# Patient Record
Sex: Female | Born: 1950 | Race: White | Hispanic: No | Marital: Married | Smoking: Never smoker
Health system: Southern US, Community
[De-identification: ages and names within clinical notes are randomized; demographics above are authoritative.]

## PROBLEM LIST (undated history)

## (undated) DIAGNOSIS — R519 Headache, unspecified: Secondary | ICD-10-CM

## (undated) DIAGNOSIS — K219 Gastro-esophageal reflux disease without esophagitis: Secondary | ICD-10-CM

## (undated) DIAGNOSIS — M654 Radial styloid tenosynovitis [de Quervain]: Secondary | ICD-10-CM

## (undated) DIAGNOSIS — D649 Anemia, unspecified: Secondary | ICD-10-CM

## (undated) DIAGNOSIS — I10 Essential (primary) hypertension: Secondary | ICD-10-CM

## (undated) DIAGNOSIS — L570 Actinic keratosis: Secondary | ICD-10-CM

## (undated) DIAGNOSIS — D126 Benign neoplasm of colon, unspecified: Secondary | ICD-10-CM

## (undated) DIAGNOSIS — E785 Hyperlipidemia, unspecified: Secondary | ICD-10-CM

## (undated) DIAGNOSIS — M069 Rheumatoid arthritis, unspecified: Secondary | ICD-10-CM

## (undated) HISTORY — PX: COLONOSCOPY: SHX174

## (undated) HISTORY — PX: APPENDECTOMY: SHX54

## (undated) HISTORY — PX: FLEXIBLE SIGMOIDOSCOPY: SHX5431

## (undated) HISTORY — DX: Actinic keratosis: L57.0

---

## 2006-11-26 ENCOUNTER — Ambulatory Visit: Payer: Self-pay | Admitting: Family Medicine

## 2007-03-01 ENCOUNTER — Ambulatory Visit: Payer: Self-pay | Admitting: Unknown Physician Specialty

## 2008-02-29 ENCOUNTER — Ambulatory Visit: Payer: Self-pay | Admitting: Family Medicine

## 2009-03-07 ENCOUNTER — Ambulatory Visit: Payer: Self-pay | Admitting: Family Medicine

## 2010-04-25 ENCOUNTER — Ambulatory Visit: Payer: Self-pay | Admitting: Family Medicine

## 2011-06-10 ENCOUNTER — Ambulatory Visit: Payer: Self-pay | Admitting: Family Medicine

## 2012-03-02 ENCOUNTER — Ambulatory Visit: Payer: Self-pay | Admitting: Family Medicine

## 2012-04-05 ENCOUNTER — Ambulatory Visit: Payer: Self-pay | Admitting: Unknown Physician Specialty

## 2012-04-05 LAB — BASIC METABOLIC PANEL
Anion Gap: 7 (ref 7–16)
BUN: 5 mg/dL — ABNORMAL LOW (ref 7–18)
Chloride: 103 mmol/L (ref 98–107)
Co2: 27 mmol/L (ref 21–32)
Creatinine: 0.78 mg/dL (ref 0.60–1.30)
EGFR (African American): >60
Glucose: 87 mg/dL (ref 65–99)
Potassium: 4.2 mmol/L (ref 3.5–5.1)
Sodium: 137 mmol/L (ref 136–145)

## 2012-04-06 ENCOUNTER — Ambulatory Visit: Payer: Self-pay | Admitting: Surgery

## 2012-07-20 ENCOUNTER — Ambulatory Visit: Payer: Self-pay | Admitting: Family Medicine

## 2013-07-21 ENCOUNTER — Ambulatory Visit: Payer: Self-pay | Admitting: Family Medicine

## 2013-10-06 ENCOUNTER — Ambulatory Visit: Payer: Self-pay | Admitting: Podiatry

## 2014-03-31 DIAGNOSIS — M059 Rheumatoid arthritis with rheumatoid factor, unspecified: Secondary | ICD-10-CM | POA: Insufficient documentation

## 2014-08-23 ENCOUNTER — Ambulatory Visit: Payer: Self-pay | Admitting: Family Medicine

## 2014-10-30 ENCOUNTER — Ambulatory Visit: Payer: Self-pay | Admitting: Family Medicine

## 2014-11-01 ENCOUNTER — Ambulatory Visit: Payer: Self-pay | Admitting: Family Medicine

## 2014-11-15 ENCOUNTER — Ambulatory Visit: Payer: Self-pay | Admitting: Family Medicine

## 2014-12-19 NOTE — H&P (Signed)
PATIENT NAME:  Deborah Singh, Deborah Singh MR#:  [id number] DATE OF BIRTH:  [birth year]  DATE OF ADMISSION:  04/05/2012  HISTORY OF PRESENT ILLNESS: This 64 year old female was referred by Dr. Vira Agar with chief complaint of abnormal colonoscopy and CT findings indicative of appendicitis.   She just had her screening colonoscopy today. There was some evidence of hemorrhoids and also Dr. Vira Agar found evidence of edema in the cecum with a small amount of exudate. Biopsies were taken. Postoperatively she did have a CT scan of the abdomen. She did leave the CT scanner and went out to eat and had regular diet for lunch. Later Dr. Vira Agar received a call from the radiologist indicating findings of appendicitis, and the patient was referred here. I reviewed the CT findings with report of a dilated appendix which was 1.6 cm in diameter with mild inflammatory changes in the periappendiceal fat, a trace amount of fluid, and these findings were reported as concerning for appendicitis.   The patient reports no abdominal pain. She says she has been eating well, having no nausea or vomiting. She has been moving her bowels satisfactorily, tolerated her colon prep very well, and tolerated her lunch after the procedure. She reports no recent chills or fever.  PAST MEDICAL HISTORY:  1. Past medical history was reviewed and does include rheumatoid arthritis. She has been under the care of Dr. Precious Reel and on treatment. She does have some minor discomfort in her fingers.  2. History of hypertension. 3. Hyperlipidemia. 4. Gastroesophageal reflux disease.   She reports no history of heart disease, lung disease, or hepatitis, and no history of diabetes.   MEDICATIONS:  1. Aspirin 81 mg daily and has been taking her aspirin every day.  2. Calcium tablet twice a day. 3. Folic acid 1 mg daily. 4. Lisinopril/hydrochlorothiazide 10/12.5 mg daily.  5. Multiple vitamin daily. 6. Oxaprozin 600 mg daily, but has discontinued that  just recently prior to colonoscopy.  7. Prilosec 20 mg daily.  8. Simvastatin 20 mg daily. 9. Methotrexate 2.5 mg each Wednesday, so it has been five days since the last dose.   ALLERGIES: Penicillin - which causes some shortness of breath.   SOCIAL HISTORY: She works at [employer]. She does not smoke, occasionally drinks some alcohol.   FAMILY HISTORY: Positive for diabetes, heart disease, hypertension, and kidney disease. Family history is negative for colon cancer and negative for rectal cancer.   REVIEW OF SYSTEMS: No recent acute illness such as cough, cold, or sore throat. No nausea, vomiting, constipation, or diarrhea. No abdominal pain. She reports no difficulties breathing. Review of systems is otherwise negative.   PHYSICAL EXAMINATION:   GENERAL: She is awake, alert, and oriented, ambulatory, in no acute distress.   VITAL SIGNS: Height is 5 feet 4 inches, weight 130, blood pressure 121/81, and pulse 76.   SKIN: Warm and dry without rash.   HEENT: Pupils are equal and reactive to light. Extraocular movements are intact. Sclerae clear. Pharynx clear.   NECK: No palpable mass.   LUNGS: Lungs sounds were clear.   HEART: Regular rhythm, S1 and S2.   ABDOMEN: Soft, flat, and nontender.   EXTREMITIES: No dependent edema.   NEUROLOGIC: Awake, alert, and oriented, moving all extremities.   RESULTS: CT images were reviewed and colonoscopy report reviewed.   IMPRESSION: Chronic appendicitis.   RECOMMENDATIONS: I recommended laparoscopic appendectomy. I have discussed the operation, care, risks, and benefits with her in detail. We are going to keep her n.p.o. past  midnight, have her take her omeprazole and lisinopril with hydrochlorothiazide in the morning with a sip of water, and we are going to have her do a preop, and get CBC and metabolic panel B. We will get her surgery scheduled for tomorrow and give a preoperative dose of Invanz. I discussed the operation, care, risks, and  benefits and the small possibility of having to open with her. ____________________________ J. Rochel Brome, MD jws:slb D: 04/05/2012 17:28:52 ET T: 04/05/2012 17:45:20 ET JOB#: [id number]  cc: Loreli Dollar, MD, <Dictator> Irven Easterly. Kary Kos, MD Manya Silvas, MD

## 2014-12-19 NOTE — H&P (Signed)
PATIENT NAME:  Deborah Singh, Deborah Singh MR#:  [id number] DATE OF BIRTH:  06-01-1951  DATE OF ADMISSION:  04/06/2012  HISTORY OF PRESENT ILLNESS: This 64 year old female was referred by Dr. Vira Agar with chief complaint of abnormal colonoscopy and CT findings indicative of appendicitis.   She just had her screening colonoscopy today. There was some evidence of hemorrhoids and also Dr. Vira Agar found evidence of edema in the cecum with a small amount of exudate. Biopsies were taken. Postoperatively she did have a CT scan of the abdomen. She did leave the CT scanner and went out to eat and had regular diet for lunch. Later Dr. Vira Agar received a call from the radiologist indicating findings of appendicitis, and the patient was referred here. I reviewed the CT findings with report of a dilated appendix which was 1.6 cm in diameter with mild inflammatory changes in the periappendiceal fat, a trace amount of fluid, and these findings were reported as concerning for appendicitis.   The patient reports no abdominal pain. She says she has been eating well, having no nausea or vomiting. She has been moving her bowels satisfactorily, tolerated her colon prep very well, and tolerated her lunch after the procedure. She reports no recent chills or fever.  PAST MEDICAL HISTORY:  1. Past medical history was reviewed and does include rheumatoid arthritis. She has been under the care of Dr. Precious Reel and on treatment. She does have some minor discomfort in her fingers.  2. History of hypertension. 3. Hyperlipidemia. 4. Gastroesophageal reflux disease.   She reports no history of heart disease, lung disease, or hepatitis, and no history of diabetes.   MEDICATIONS:  1. Aspirin 81 mg daily and has been taking her aspirin every day.  2. Calcium tablet twice a day. 3. Folic acid 1 mg daily. 4. Lisinopril/hydrochlorothiazide 10/12.5 mg daily.  5. Multiple vitamin daily. 6. Oxaprozin 600 mg daily, but has discontinued that  just recently prior to colonoscopy.  7. Prilosec 20 mg daily.  8. Simvastatin 20 mg daily. 9. Methotrexate 2.5 mg each Wednesday, so it has been five days since the last dose.   ALLERGIES: Penicillin - which causes some shortness of breath.   SOCIAL HISTORY: She works at [employer]. She does not smoke, occasionally drinks some alcohol.   FAMILY HISTORY: Positive for diabetes, heart disease, hypertension, and kidney disease. Family history is negative for colon cancer and negative for rectal cancer.   REVIEW OF SYSTEMS: No recent acute illness such as cough, cold, or sore throat. No nausea, vomiting, constipation, or diarrhea. No abdominal pain. She reports no difficulties breathing. Review of systems is otherwise negative.   PHYSICAL EXAMINATION:   GENERAL: She is awake, alert, and oriented, ambulatory, in no acute distress.   VITAL SIGNS: Height is 5 feet 4 inches, weight 130, blood pressure 121/81, and pulse 76.   SKIN: Warm and dry without rash.   HEENT: Pupils are equal and reactive to light. Extraocular movements are intact. Sclerae clear. Pharynx clear.   NECK: No palpable mass.   LUNGS: Lungs sounds were clear.   HEART: Regular rhythm, S1 and S2.   ABDOMEN: Soft, flat, and nontender.   EXTREMITIES: No dependent edema.   NEUROLOGIC: Awake, alert, and oriented, moving all extremities.   RESULTS: CT images were reviewed and colonoscopy report reviewed.   IMPRESSION: Chronic appendicitis.   RECOMMENDATIONS: I recommended laparoscopic appendectomy. I have discussed the operation, care, risks, and benefits with her in detail. We are going to keep her n.p.o. past  midnight, have her take her omeprazole and lisinopril with hydrochlorothiazide in the morning with a sip of water, and we are going to have her do a preop, and get CBC and metabolic panel B. We will get her surgery scheduled for tomorrow and give a preoperative dose of Invanz. I discussed the operation, care, risks, and  benefits and the small possibility of having to open with her. ____________________________ J. Rochel Brome, MD jws:slb D: 04/05/2012 17:28:00 ET T: 04/05/2012 17:45:20 ET JOB#: [id number]  cc: Irven Easterly. Kary Kos, MD Manya Silvas, MD J. Rochel Brome, MD, <Dictator>  Loreli Dollar MD ELECTRONICALLY SIGNED 04/07/2012 8:57

## 2014-12-19 NOTE — Op Note (Signed)
PATIENT NAME:  Deborah Singh, Deborah Singh MR#:  [id number] DATE OF BIRTH:  09-19-1950  DATE OF PROCEDURE:  04/06/2012  PREOPERATIVE DIAGNOSIS: Acute appendicitis.   POSTOPERATIVE DIAGNOSIS: Acute appendicitis.   PROCEDURE: Laparoscopic appendectomy.   SURGEON: Loreli Dollar, MD   ANESTHESIA: General.   INDICATIONS: She had a colonoscopy the day before surgery with findings of edema of the cecum. CT scan demonstrated dilated appendix. She had no tenderness but with this finding there was concern of possible appendicitis or possible obstruction of the appendix. Surgery was recommended for definitive treatment.   DESCRIPTION OF PROCEDURE: The patient was placed on the operating table in the supine position under general endotracheal anesthesia. The abdomen was prepared with ChloraPrep and draped in a sterile manner. A short incision was made in the inferior aspect of the umbilicus and carried down to the deep fascia which was grasped with laryngeal hook and elevated. A Veress needle was inserted, aspirated, and irrigated with a saline solution. Next, the peritoneal cavity was inflated with carbon dioxide. The Veress needle was removed. The 10 mm cannula was inserted. The 10 mm 0 degree laparoscope was inserted to view the peritoneal cavity. The location of the cecum was demonstrated in the right lower quadrant. Another incision was made in the left lower quadrant lateral to the inferior epigastric vessels to insert a 12 mm cannula. Another incision was made in the right lower quadrant lateral to the inferior epigastric vessels to insert a 5 mm cannula.   The patient was placed in the Trendelenburg position and turned several degrees towards the left exposing the cecum which was grasped with Babcock clamp and elevated. There was no grossly apparent edema of the cecum and the terminal ileum appeared normal. There was no free floating fluid. The appendix was found to be markedly dilated but not gangrenous. Next,  a window was created in the mesoappendix and then the blue cartridge EndoGIA stapler was placed across the appendix, engaged, and activated dividing the appendix and the cecum placing three rows of hemostatic staples on each side. The mesoappendix was divided with the white load GIA stapler. It is noted that a number of small bleeding points were cauterized. A small amount of blood was aspirated. Hemostasis subsequently appeared to be intact. The appendix was placed into an EndoCatch bag and delivered out through the 12 mm port site. It did not have any grossly palpable mass but was submitted in formalin for routine pathology. The right lower quadrant was further inspected and determined that hemostasis was intact. Subsequently, cannulas were removed allowing carbon dioxide to escape from the peritoneal cavity. The skin incisions were closed with interrupted 5-0 chromic subcuticular sutures, benzoin, and Steri-Strips. Dressings were applied with paper tape. The patient tolerated surgery satisfactorily and was then prepared for transfer to the recovery room.   ____________________________ Lenna Sciara. Rochel Brome, MD jws:drc D: 04/13/2012 10:27:34 ET T: 04/13/2012 11:21:57 ET JOB#: [id number]  cc: Loreli Dollar, MD, <Dictator> Loreli Dollar MD ELECTRONICALLY SIGNED 04/14/2012 12:34

## 2015-10-01 DIAGNOSIS — M7552 Bursitis of left shoulder: Secondary | ICD-10-CM | POA: Insufficient documentation

## 2015-10-01 DIAGNOSIS — M7582 Other shoulder lesions, left shoulder: Secondary | ICD-10-CM | POA: Insufficient documentation

## 2016-06-06 ENCOUNTER — Other Ambulatory Visit: Payer: Self-pay | Admitting: Family Medicine

## 2016-06-06 DIAGNOSIS — Z1231 Encounter for screening mammogram for malignant neoplasm of breast: Secondary | ICD-10-CM

## 2016-07-03 ENCOUNTER — Ambulatory Visit
Admission: RE | Admit: 2016-07-03 | Discharge: 2016-07-03 | Disposition: A | Discharge status: Home / Self Care | Payer: 59 | Source: Ambulatory Visit | Attending: Family Medicine | Admitting: Family Medicine

## 2016-07-03 DIAGNOSIS — Z1231 Encounter for screening mammogram for malignant neoplasm of breast: Secondary | ICD-10-CM | POA: Diagnosis not present

## 2016-07-11 ENCOUNTER — Ambulatory Visit: Payer: Self-pay

## 2016-11-19 DIAGNOSIS — S0512XA Contusion of eyeball and orbital tissues, left eye, initial encounter: Secondary | ICD-10-CM | POA: Diagnosis not present

## 2016-11-21 DIAGNOSIS — Z79899 Other long term (current) drug therapy: Secondary | ICD-10-CM | POA: Diagnosis not present

## 2017-02-12 DIAGNOSIS — Z79899 Other long term (current) drug therapy: Secondary | ICD-10-CM | POA: Diagnosis not present

## 2017-02-13 DIAGNOSIS — Z86010 Personal history of colonic polyps: Secondary | ICD-10-CM | POA: Diagnosis not present

## 2017-02-13 DIAGNOSIS — L821 Other seborrheic keratosis: Secondary | ICD-10-CM | POA: Diagnosis not present

## 2017-02-13 DIAGNOSIS — Z85828 Personal history of other malignant neoplasm of skin: Secondary | ICD-10-CM | POA: Diagnosis not present

## 2017-02-13 DIAGNOSIS — L570 Actinic keratosis: Secondary | ICD-10-CM | POA: Diagnosis not present

## 2017-02-16 DIAGNOSIS — M0579 Rheumatoid arthritis with rheumatoid factor of multiple sites without organ or systems involvement: Secondary | ICD-10-CM | POA: Diagnosis not present

## 2017-02-16 DIAGNOSIS — Z79899 Other long term (current) drug therapy: Secondary | ICD-10-CM | POA: Diagnosis not present

## 2017-02-16 DIAGNOSIS — M720 Palmar fascial fibromatosis [Dupuytren]: Secondary | ICD-10-CM | POA: Diagnosis not present

## 2017-05-19 DIAGNOSIS — M0579 Rheumatoid arthritis with rheumatoid factor of multiple sites without organ or systems involvement: Secondary | ICD-10-CM | POA: Diagnosis not present

## 2017-05-19 DIAGNOSIS — D229 Melanocytic nevi, unspecified: Secondary | ICD-10-CM | POA: Diagnosis not present

## 2017-05-19 DIAGNOSIS — L570 Actinic keratosis: Secondary | ICD-10-CM | POA: Diagnosis not present

## 2017-05-19 DIAGNOSIS — L820 Inflamed seborrheic keratosis: Secondary | ICD-10-CM | POA: Diagnosis not present

## 2017-05-19 DIAGNOSIS — L814 Other melanin hyperpigmentation: Secondary | ICD-10-CM | POA: Diagnosis not present

## 2017-05-19 DIAGNOSIS — Z79899 Other long term (current) drug therapy: Secondary | ICD-10-CM | POA: Diagnosis not present

## 2017-05-19 DIAGNOSIS — L578 Other skin changes due to chronic exposure to nonionizing radiation: Secondary | ICD-10-CM | POA: Diagnosis not present

## 2017-05-19 DIAGNOSIS — Z1283 Encounter for screening for malignant neoplasm of skin: Secondary | ICD-10-CM | POA: Diagnosis not present

## 2017-05-19 DIAGNOSIS — L821 Other seborrheic keratosis: Secondary | ICD-10-CM | POA: Diagnosis not present

## 2017-05-19 DIAGNOSIS — L0109 Other impetigo: Secondary | ICD-10-CM | POA: Diagnosis not present

## 2017-05-19 DIAGNOSIS — Z85828 Personal history of other malignant neoplasm of skin: Secondary | ICD-10-CM | POA: Diagnosis not present

## 2017-06-17 ENCOUNTER — Encounter
Admission: RE | Disposition: A | Discharge status: Home / Self Care | Payer: Self-pay | Source: Ambulatory Visit | Attending: Unknown Physician Specialty

## 2017-06-17 ENCOUNTER — Ambulatory Visit: Payer: PPO | Admitting: Anesthesiology

## 2017-06-17 ENCOUNTER — Encounter: Payer: Self-pay | Admitting: *Deleted

## 2017-06-17 ENCOUNTER — Ambulatory Visit
Admission: RE | Admit: 2017-06-17 | Discharge: 2017-06-17 | Disposition: A | Discharge status: Home / Self Care | Payer: PPO | Source: Ambulatory Visit | Attending: Unknown Physician Specialty | Admitting: Unknown Physician Specialty

## 2017-06-17 DIAGNOSIS — Z7982 Long term (current) use of aspirin: Secondary | ICD-10-CM | POA: Diagnosis not present

## 2017-06-17 DIAGNOSIS — K640 First degree hemorrhoids: Secondary | ICD-10-CM | POA: Insufficient documentation

## 2017-06-17 DIAGNOSIS — Z09 Encounter for follow-up examination after completed treatment for conditions other than malignant neoplasm: Secondary | ICD-10-CM | POA: Insufficient documentation

## 2017-06-17 DIAGNOSIS — I10 Essential (primary) hypertension: Secondary | ICD-10-CM | POA: Insufficient documentation

## 2017-06-17 DIAGNOSIS — E785 Hyperlipidemia, unspecified: Secondary | ICD-10-CM | POA: Diagnosis not present

## 2017-06-17 DIAGNOSIS — M069 Rheumatoid arthritis, unspecified: Secondary | ICD-10-CM | POA: Diagnosis not present

## 2017-06-17 DIAGNOSIS — Z79899 Other long term (current) drug therapy: Secondary | ICD-10-CM | POA: Insufficient documentation

## 2017-06-17 DIAGNOSIS — Z8719 Personal history of other diseases of the digestive system: Secondary | ICD-10-CM | POA: Insufficient documentation

## 2017-06-17 DIAGNOSIS — K219 Gastro-esophageal reflux disease without esophagitis: Secondary | ICD-10-CM | POA: Diagnosis not present

## 2017-06-17 DIAGNOSIS — K648 Other hemorrhoids: Secondary | ICD-10-CM | POA: Diagnosis not present

## 2017-06-17 DIAGNOSIS — Z86010 Personal history of colonic polyps: Secondary | ICD-10-CM | POA: Diagnosis not present

## 2017-06-17 HISTORY — DX: Gastro-esophageal reflux disease without esophagitis: K21.9

## 2017-06-17 HISTORY — PX: COLONOSCOPY WITH PROPOFOL: SHX5780

## 2017-06-17 HISTORY — DX: Essential (primary) hypertension: I10

## 2017-06-17 HISTORY — DX: Radial styloid tenosynovitis (de quervain): M65.4

## 2017-06-17 HISTORY — DX: Benign neoplasm of colon, unspecified: D12.6

## 2017-06-17 HISTORY — DX: Rheumatoid arthritis, unspecified: M06.9

## 2017-06-17 HISTORY — DX: Hyperlipidemia, unspecified: E78.5

## 2017-06-17 SURGERY — COLONOSCOPY WITH PROPOFOL
Anesthesia: General

## 2017-06-17 MED ORDER — SODIUM CHLORIDE 0.9 % IV SOLN: INTRAVENOUS | Status: DC

## 2017-06-17 MED ORDER — MIDAZOLAM HCL 5 MG/5ML IJ SOLN
INTRAMUSCULAR | Status: DC | PRN
  Administered 2017-06-17: 2 mg via INTRAVENOUS

## 2017-06-17 MED ORDER — MIDAZOLAM HCL 2 MG/2ML IJ SOLN
INTRAMUSCULAR | Status: AC
  Filled 2017-06-17: qty 2

## 2017-06-17 MED ORDER — PROPOFOL 10 MG/ML IV BOLUS
INTRAVENOUS | Status: DC | PRN
  Administered 2017-06-17: 30 mg via INTRAVENOUS
  Administered 2017-06-17: 70 mg via INTRAVENOUS

## 2017-06-17 MED ORDER — PROPOFOL 500 MG/50ML IV EMUL
INTRAVENOUS | Status: DC | PRN
  Administered 2017-06-17: 120 ug/kg/min via INTRAVENOUS

## 2017-06-17 MED ORDER — SODIUM CHLORIDE 0.9 % IV SOLN
INTRAVENOUS | Status: DC
  Administered 2017-06-17: 1000 mL via INTRAVENOUS

## 2017-06-17 NOTE — Anesthesia Post-op Follow-up Note (Signed)
Anesthesia QCDR form completed.        

## 2017-06-17 NOTE — Op Note (Signed)
[facility] Gastroenterology Patient Name: Deborah Singh Procedure Date: 06/17/2017 3:50 PM MRN: [id number] Account #: [id number] Date of Birth: May 21, 1951 Admit Type: Outpatient Age: 66 Room: [facility] An ENDO ROOM 4 Gender: Female Note Status: Finalized Procedure:            Colonoscopy Indications:          High risk colon cancer surveillance: Personal history                        of colonic polyps Providers:            Manya Silvas, MD Referring MD:         Irven Easterly. Kary Kos, MD (Referring MD) Medicines:            Propofol per Anesthesia Complications:        No immediate complications. Procedure:            Pre-Anesthesia Assessment:                       - After reviewing the risks and benefits, the patient                        was deemed in satisfactory condition to undergo the                        procedure.                       After obtaining informed consent, the colonoscope was                        passed under direct vision. Throughout the procedure,                        the patient's blood pressure, pulse, and oxygen                        saturations were monitored continuously. The                        Colonoscope was introduced through the anus and                        advanced to the the cecum, identified by appendiceal                        orifice and ileocecal valve. The colonoscopy was                        performed without difficulty. The patient tolerated the                        procedure well. The quality of the bowel preparation                        was good. Findings:      The colon (entire examined portion) appeared normal. This was the entire       colon.      Internal hemorrhoids were found during endoscopy. The hemorrhoids were       small and Grade I (internal hemorrhoids that do not prolapse).  Impression:           - The entire examined colon is normal.                       - Internal hemorrhoids.                 - No specimens collected. Recommendation:       - Repeat colonoscopy in 5 years for surveillance. Manya Silvas, MD 06/17/2017 4:13:28 PM This report has been signed electronically. Number of Addenda: 0 Note Initiated On: 06/17/2017 3:50 PM Scope Withdrawal Time: 0 hours 8 minutes 52 seconds  Total Procedure Duration: 0 hours 14 minutes 37 seconds       [facility]

## 2017-06-17 NOTE — H&P (Signed)
Primary Care Physician:  [place] Pink, MD Primary Gastroenterologist:  Dr. Vira Agar  Pre-Procedure History & Physical: HPI:  Deborah Singh is a 66 y.o. female is here for an colonoscopy.   Past Medical History:  Diagnosis Date  . Colon adenomas   . De Quervain's tenosynovitis   . GERD (gastroesophageal reflux disease)   . Hyperlipidemia   . Hypertension   . Rheumatoid arthritis [facility])     Past Surgical History:  Procedure Laterality Date  . APPENDECTOMY    . COLONOSCOPY    . FLEXIBLE SIGMOIDOSCOPY      Prior to Admission medications   Medication Sig Start Date End Date Taking? Authorizing Provider  aspirin EC 81 MG tablet Take 81 mg by mouth daily.   Yes [provider]  Cholecalciferol 1000 units TBDP Take by mouth daily.   Yes [provider]  lisinopril-hydrochlorothiazide (PRINZIDE,ZESTORETIC) 10-12.5 MG tablet Take 1 tablet by mouth daily.   Yes [provider]  methotrexate 2.5 MG tablet Take 2.5 mg by mouth once a week.   Yes [provider]  omeprazole (PRILOSEC) 20 MG capsule Take 20 mg by mouth daily.   Yes [provider]  simvastatin (ZOCOR) 20 MG tablet Take 20 mg by mouth daily.   Yes [provider]  SM OMEGA-3-6-9 FATTY ACIDS PO Take by mouth 2 (two) times daily.   Yes [provider]  sulindac (CLINORIL) 150 MG tablet Take 150 mg by mouth 2 (two) times daily.   Yes [provider]  Flaxseed, Linseed, (FLAXSEED OIL PO) Take by mouth daily.    [provider]  guaiFENesin-codeine (ROBITUSSIN AC) 100-10 MG/5ML syrup Take 5 mLs by mouth every 4 (four) hours as needed for cough.    [provider]  oxaprozin (DAYPRO) 600 MG tablet Take by mouth daily.    [provider]    Allergies as of 02/25/2017  . (Not on File)    History reviewed. No pertinent family history.  Social History   Social History  . Marital status: Married    Spouse name: N/A  .  Number of children: N/A  . Years of education: N/A   Occupational History  . Not on file.   Social History Main Topics  . Smoking status: Never Smoker  . Smokeless tobacco: Never Used  . Alcohol use Yes     Comment: occ  . Drug use: No  . Sexual activity: No   Other Topics Concern  . Not on file   Social History Narrative  . No narrative on file    Review of Systems: See HPI, otherwise negative ROS  Physical Exam: BP 122/72   Pulse 78   Temp 98.3 F (36.8 C) (Tympanic)   Resp 20   Ht 5\' 4"  (1.626 m)   Wt 59.9 kg (132 lb)   SpO2 100%   BMI 22.66 kg/m  General:   Alert,  pleasant and cooperative in NAD Head:  Normocephalic and atraumatic. Neck:  Supple; no masses or thyromegaly. Lungs:  Clear throughout to auscultation.    Heart:  Regular rate and rhythm. Abdomen:  Soft, nontender and nondistended. Normal bowel sounds, without guarding, and without rebound.   Neurologic:  Alert and  oriented x4;  grossly normal neurologically.  Impression/Plan: Deborah Singh is here for an colonoscopy to be performed for [facility] colon polyps  Risks, benefits, limitations, and alternatives regarding  colonoscopy have been reviewed with the patient.  Questions have been answered.  All parties agreeable.   Gaylyn Cheers, MD  06/17/2017, 3:46 PM

## 2017-06-17 NOTE — Anesthesia Preprocedure Evaluation (Signed)
Anesthesia Evaluation  Patient identified by MRN, date of birth, ID band Patient awake    Reviewed: Allergy & Precautions, H&P , NPO status , Patient's Chart, lab work & pertinent test results, reviewed documented beta blocker date and time   History of Anesthesia Complications Negative for: history of anesthetic complications  Airway Mallampati: I  TM Distance: >3 FB Neck ROM: full    Dental  (+) Caps, Dental Advidsory Given, Teeth Intact   Pulmonary neg pulmonary ROS,           Cardiovascular Exercise Tolerance: Good hypertension, (-) angina(-) CAD, (-) Past MI, (-) Cardiac Stents and (-) CABG (-) dysrhythmias (-) Valvular Problems/Murmurs     Neuro/Psych negative neurological ROS  negative psych ROS   GI/Hepatic Neg liver ROS, GERD  ,  Endo/Other  negative endocrine ROS  Renal/GU negative Renal ROS  negative genitourinary   Musculoskeletal   Abdominal   Peds  Hematology negative hematology ROS (+)   Anesthesia Other Findings Past Medical History: No date: Colon adenomas No date: De Quervain's tenosynovitis No date: GERD (gastroesophageal reflux disease) No date: Hyperlipidemia No date: Hypertension No date: Rheumatoid arthritis (HCC)   Reproductive/Obstetrics negative OB ROS                             Anesthesia Physical Anesthesia Plan  ASA: II  Anesthesia Plan: General   Post-op Pain Management:    Induction: Intravenous  PONV Risk Score and Plan: 3 and Propofol infusion  Airway Management Planned: Nasal Cannula  Additional Equipment:   Intra-op Plan:   Post-operative Plan:   Informed Consent: I have reviewed the patients History and Physical, chart, labs and discussed the procedure including the risks, benefits and alternatives for the proposed anesthesia with the patient or authorized representative who has indicated his/her understanding and acceptance.    Dental Advisory Given  Plan Discussed with: Anesthesiologist, CRNA and Surgeon  Anesthesia Plan Comments:         Anesthesia Quick Evaluation

## 2017-06-17 NOTE — Anesthesia Postprocedure Evaluation (Signed)
Anesthesia Post Note  Patient: Deborah Singh  Procedure(s) Performed: COLONOSCOPY WITH PROPOFOL (N/A )  Patient location during evaluation: Endoscopy Anesthesia Type: General Level of consciousness: awake and alert Pain management: pain level controlled Vital Signs Assessment: post-procedure vital signs reviewed and stable Respiratory status: spontaneous breathing, nonlabored ventilation, respiratory function stable and patient connected to nasal cannula oxygen Cardiovascular status: blood pressure returned to baseline and stable Postop Assessment: no apparent nausea or vomiting Anesthetic complications: no     Last Vitals:  Vitals:   06/17/17 1637 06/17/17 1647  BP: 120/72 128/79  Pulse: 85 66  Resp: (!) 25 17  Temp:    SpO2: 99% 100%    Last Pain:  Vitals:   06/17/17 1627  TempSrc:   PainSc: Asleep                 Martha Clan

## 2017-06-17 NOTE — Transfer of Care (Signed)
Immediate Anesthesia Transfer of Care Note  Patient: Deborah Singh  Procedure(s) Performed: COLONOSCOPY WITH PROPOFOL (N/A )  Patient Location: Endoscopy Unit  Anesthesia Type:General  Level of Consciousness: awake  Airway & Oxygen Therapy: Patient Spontanous Breathing and Patient connected to nasal cannula oxygen  Post-op Assessment: Report given to RN and Post -op Vital signs reviewed and stable  Post vital signs: Reviewed  Last Vitals:  Vitals:   06/17/17 1417 06/17/17 1616  BP: 122/72 113/66  Pulse: 78 77  Resp: 20 14  Temp: 36.8 C (!) 36.2 C  SpO2: 100% 99%    Last Pain:  Vitals:   06/17/17 1417  TempSrc: Tympanic      Patients Stated Pain Goal: 0 (40/10/27 2536)  Complications: No apparent anesthesia complications

## 2017-06-18 ENCOUNTER — Encounter: Payer: Self-pay | Admitting: Unknown Physician Specialty

## 2017-06-29 DIAGNOSIS — M720 Palmar fascial fibromatosis [Dupuytren]: Secondary | ICD-10-CM | POA: Diagnosis not present

## 2017-06-30 ENCOUNTER — Other Ambulatory Visit: Payer: Self-pay | Admitting: Family Medicine

## 2017-06-30 DIAGNOSIS — Z1231 Encounter for screening mammogram for malignant neoplasm of breast: Secondary | ICD-10-CM

## 2017-07-21 ENCOUNTER — Ambulatory Visit
Admission: RE | Admit: 2017-07-21 | Discharge: 2017-07-21 | Disposition: A | Discharge status: Home / Self Care | Payer: PPO | Source: Ambulatory Visit | Attending: Family Medicine | Admitting: Family Medicine

## 2017-07-21 DIAGNOSIS — Z1231 Encounter for screening mammogram for malignant neoplasm of breast: Secondary | ICD-10-CM | POA: Diagnosis not present

## 2017-07-29 DIAGNOSIS — M720 Palmar fascial fibromatosis [Dupuytren]: Secondary | ICD-10-CM | POA: Diagnosis not present

## 2017-07-31 DIAGNOSIS — M720 Palmar fascial fibromatosis [Dupuytren]: Secondary | ICD-10-CM | POA: Diagnosis not present

## 2017-07-31 DIAGNOSIS — M79641 Pain in right hand: Secondary | ICD-10-CM | POA: Diagnosis not present

## 2017-08-03 DIAGNOSIS — M79641 Pain in right hand: Secondary | ICD-10-CM | POA: Diagnosis not present

## 2017-08-07 DIAGNOSIS — M79641 Pain in right hand: Secondary | ICD-10-CM | POA: Diagnosis not present

## 2017-08-13 DIAGNOSIS — Z79899 Other long term (current) drug therapy: Secondary | ICD-10-CM | POA: Diagnosis not present

## 2017-08-13 DIAGNOSIS — M0579 Rheumatoid arthritis with rheumatoid factor of multiple sites without organ or systems involvement: Secondary | ICD-10-CM | POA: Diagnosis not present

## 2017-08-14 DIAGNOSIS — M720 Palmar fascial fibromatosis [Dupuytren]: Secondary | ICD-10-CM | POA: Diagnosis not present

## 2017-08-14 DIAGNOSIS — M79641 Pain in right hand: Secondary | ICD-10-CM | POA: Diagnosis not present

## 2017-08-18 DIAGNOSIS — M79641 Pain in right hand: Secondary | ICD-10-CM | POA: Diagnosis not present

## 2017-08-18 DIAGNOSIS — M720 Palmar fascial fibromatosis [Dupuytren]: Secondary | ICD-10-CM | POA: Diagnosis not present

## 2017-08-18 DIAGNOSIS — M0579 Rheumatoid arthritis with rheumatoid factor of multiple sites without organ or systems involvement: Secondary | ICD-10-CM | POA: Diagnosis not present

## 2017-08-26 DIAGNOSIS — M79641 Pain in right hand: Secondary | ICD-10-CM | POA: Diagnosis not present

## 2017-09-29 DIAGNOSIS — Z85828 Personal history of other malignant neoplasm of skin: Secondary | ICD-10-CM | POA: Diagnosis not present

## 2017-09-29 DIAGNOSIS — L570 Actinic keratosis: Secondary | ICD-10-CM | POA: Diagnosis not present

## 2017-09-29 DIAGNOSIS — L821 Other seborrheic keratosis: Secondary | ICD-10-CM | POA: Diagnosis not present

## 2017-10-02 DIAGNOSIS — E785 Hyperlipidemia, unspecified: Secondary | ICD-10-CM | POA: Diagnosis not present

## 2017-10-02 DIAGNOSIS — I10 Essential (primary) hypertension: Secondary | ICD-10-CM | POA: Diagnosis not present

## 2017-10-13 DIAGNOSIS — E785 Hyperlipidemia, unspecified: Secondary | ICD-10-CM | POA: Diagnosis not present

## 2017-10-13 DIAGNOSIS — R9431 Abnormal electrocardiogram [ECG] [EKG]: Secondary | ICD-10-CM | POA: Diagnosis not present

## 2017-10-13 DIAGNOSIS — Z124 Encounter for screening for malignant neoplasm of cervix: Secondary | ICD-10-CM | POA: Diagnosis not present

## 2017-10-13 DIAGNOSIS — I10 Essential (primary) hypertension: Secondary | ICD-10-CM | POA: Diagnosis not present

## 2017-10-13 DIAGNOSIS — M542 Cervicalgia: Secondary | ICD-10-CM | POA: Diagnosis not present

## 2017-10-13 DIAGNOSIS — Z0000 Encounter for general adult medical examination without abnormal findings: Secondary | ICD-10-CM | POA: Diagnosis not present

## 2017-10-14 ENCOUNTER — Other Ambulatory Visit: Payer: Self-pay | Admitting: Family Medicine

## 2017-10-14 DIAGNOSIS — M542 Cervicalgia: Secondary | ICD-10-CM

## 2017-10-20 ENCOUNTER — Other Ambulatory Visit (HOSPITAL_COMMUNITY): Payer: Self-pay | Admitting: Family Medicine

## 2017-10-20 ENCOUNTER — Ambulatory Visit
Admission: RE | Admit: 2017-10-20 | Discharge: 2017-10-20 | Disposition: A | Discharge status: Home / Self Care | Payer: PPO | Source: Ambulatory Visit | Attending: Family Medicine | Admitting: Family Medicine

## 2017-10-20 DIAGNOSIS — M542 Cervicalgia: Secondary | ICD-10-CM | POA: Diagnosis not present

## 2017-10-20 DIAGNOSIS — Z8249 Family history of ischemic heart disease and other diseases of the circulatory system: Secondary | ICD-10-CM

## 2017-10-20 DIAGNOSIS — E041 Nontoxic single thyroid nodule: Secondary | ICD-10-CM | POA: Insufficient documentation

## 2017-10-20 DIAGNOSIS — I6523 Occlusion and stenosis of bilateral carotid arteries: Secondary | ICD-10-CM | POA: Diagnosis not present

## 2017-10-26 ENCOUNTER — Other Ambulatory Visit: Payer: Self-pay | Admitting: Family Medicine

## 2017-10-26 DIAGNOSIS — M542 Cervicalgia: Secondary | ICD-10-CM | POA: Diagnosis not present

## 2017-10-26 DIAGNOSIS — Z8249 Family history of ischemic heart disease and other diseases of the circulatory system: Secondary | ICD-10-CM | POA: Diagnosis not present

## 2017-11-02 ENCOUNTER — Other Ambulatory Visit: Payer: Self-pay | Admitting: Family Medicine

## 2017-11-02 DIAGNOSIS — R9389 Abnormal findings on diagnostic imaging of other specified body structures: Secondary | ICD-10-CM

## 2017-11-02 DIAGNOSIS — E041 Nontoxic single thyroid nodule: Secondary | ICD-10-CM

## 2017-11-04 ENCOUNTER — Ambulatory Visit
Admission: RE | Admit: 2017-11-04 | Discharge: 2017-11-04 | Disposition: A | Discharge status: Home / Self Care | Payer: PPO | Source: Ambulatory Visit | Attending: Family Medicine | Admitting: Family Medicine

## 2017-11-04 DIAGNOSIS — E041 Nontoxic single thyroid nodule: Secondary | ICD-10-CM | POA: Diagnosis not present

## 2017-11-04 DIAGNOSIS — R9389 Abnormal findings on diagnostic imaging of other specified body structures: Secondary | ICD-10-CM

## 2017-11-06 ENCOUNTER — Ambulatory Visit (HOSPITAL_COMMUNITY)
Admission: RE | Admit: 2017-11-06 | Discharge: 2017-11-06 | Disposition: A | Discharge status: Home / Self Care | Payer: PPO | Source: Ambulatory Visit | Attending: Family Medicine | Admitting: Family Medicine

## 2017-11-06 ENCOUNTER — Encounter (HOSPITAL_COMMUNITY): Payer: Self-pay

## 2017-11-06 DIAGNOSIS — Z8249 Family history of ischemic heart disease and other diseases of the circulatory system: Secondary | ICD-10-CM | POA: Insufficient documentation

## 2017-11-06 DIAGNOSIS — R6884 Jaw pain: Secondary | ICD-10-CM | POA: Insufficient documentation

## 2017-11-06 DIAGNOSIS — M542 Cervicalgia: Secondary | ICD-10-CM | POA: Diagnosis not present

## 2017-11-06 DIAGNOSIS — R079 Chest pain, unspecified: Secondary | ICD-10-CM | POA: Insufficient documentation

## 2017-11-06 LAB — POCT I-STAT CREATININE: CREATININE: 0.8 mg/dL (ref 0.44–1.00)

## 2017-11-06 MED ORDER — METOPROLOL TARTRATE 5 MG/5ML IV SOLN
5.0000 mg | Freq: Once | INTRAVENOUS | Status: AC
  Administered 2017-11-06: 5 mg via INTRAVENOUS
  Filled 2017-11-06: qty 5

## 2017-11-06 MED ORDER — METOPROLOL TARTRATE 5 MG/5ML IV SOLN
INTRAVENOUS | Status: AC
  Administered 2017-11-06: 5 mg via INTRAVENOUS
  Filled 2017-11-06: qty 10

## 2017-11-06 MED ORDER — NITROGLYCERIN 0.4 MG SL SUBL
0.4000 mg | SUBLINGUAL_TABLET | Freq: Once | SUBLINGUAL | Status: AC
  Administered 2017-11-06: 0.4 mg via SUBLINGUAL
  Filled 2017-11-06: qty 25

## 2017-11-06 MED ORDER — IOPAMIDOL (ISOVUE-370) INJECTION 76%
INTRAVENOUS | Status: AC
  Administered 2017-11-06: 80 mL
  Filled 2017-11-06: qty 100

## 2017-11-06 MED ORDER — NITROGLYCERIN 0.4 MG SL SUBL
SUBLINGUAL_TABLET | SUBLINGUAL | Status: AC
  Administered 2017-11-06: 0.4 mg via SUBLINGUAL
  Filled 2017-11-06: qty 2

## 2017-11-06 MED ORDER — METOPROLOL TARTRATE 100 MG PO TABS
100.0000 mg | ORAL_TABLET | Freq: Once | ORAL | Status: AC
  Administered 2017-11-06: 100 mg via ORAL
  Filled 2017-11-06: qty 1

## 2017-11-06 NOTE — Progress Notes (Signed)
Patient states: "I feel fine, able to walk, and ready to go home" CT Heart complete. BP stable post CT, snack and caffienated beverage consumed by patient.  Discharged home with husband, husband to drive car home.

## 2017-11-16 DIAGNOSIS — M0579 Rheumatoid arthritis with rheumatoid factor of multiple sites without organ or systems involvement: Secondary | ICD-10-CM | POA: Diagnosis not present

## 2017-11-16 DIAGNOSIS — M720 Palmar fascial fibromatosis [Dupuytren]: Secondary | ICD-10-CM | POA: Diagnosis not present

## 2017-12-07 DIAGNOSIS — E041 Nontoxic single thyroid nodule: Secondary | ICD-10-CM | POA: Diagnosis not present

## 2017-12-07 DIAGNOSIS — R1310 Dysphagia, unspecified: Secondary | ICD-10-CM | POA: Diagnosis not present

## 2018-02-04 DIAGNOSIS — E041 Nontoxic single thyroid nodule: Secondary | ICD-10-CM | POA: Diagnosis not present

## 2018-02-16 DIAGNOSIS — M720 Palmar fascial fibromatosis [Dupuytren]: Secondary | ICD-10-CM | POA: Diagnosis not present

## 2018-02-16 DIAGNOSIS — M0579 Rheumatoid arthritis with rheumatoid factor of multiple sites without organ or systems involvement: Secondary | ICD-10-CM | POA: Diagnosis not present

## 2018-02-17 DIAGNOSIS — E041 Nontoxic single thyroid nodule: Secondary | ICD-10-CM | POA: Diagnosis not present

## 2018-04-09 DIAGNOSIS — L219 Seborrheic dermatitis, unspecified: Secondary | ICD-10-CM | POA: Diagnosis not present

## 2018-04-09 DIAGNOSIS — L570 Actinic keratosis: Secondary | ICD-10-CM | POA: Diagnosis not present

## 2018-05-12 DIAGNOSIS — M720 Palmar fascial fibromatosis [Dupuytren]: Secondary | ICD-10-CM | POA: Diagnosis not present

## 2018-05-12 DIAGNOSIS — M0579 Rheumatoid arthritis with rheumatoid factor of multiple sites without organ or systems involvement: Secondary | ICD-10-CM | POA: Diagnosis not present

## 2018-05-18 DIAGNOSIS — Z1283 Encounter for screening for malignant neoplasm of skin: Secondary | ICD-10-CM | POA: Diagnosis not present

## 2018-05-18 DIAGNOSIS — L578 Other skin changes due to chronic exposure to nonionizing radiation: Secondary | ICD-10-CM | POA: Diagnosis not present

## 2018-05-18 DIAGNOSIS — D225 Melanocytic nevi of trunk: Secondary | ICD-10-CM | POA: Diagnosis not present

## 2018-05-18 DIAGNOSIS — L812 Freckles: Secondary | ICD-10-CM | POA: Diagnosis not present

## 2018-05-18 DIAGNOSIS — L739 Follicular disorder, unspecified: Secondary | ICD-10-CM | POA: Diagnosis not present

## 2018-05-18 DIAGNOSIS — L821 Other seborrheic keratosis: Secondary | ICD-10-CM | POA: Diagnosis not present

## 2018-05-18 DIAGNOSIS — L820 Inflamed seborrheic keratosis: Secondary | ICD-10-CM | POA: Diagnosis not present

## 2018-05-18 DIAGNOSIS — Z85828 Personal history of other malignant neoplasm of skin: Secondary | ICD-10-CM | POA: Diagnosis not present

## 2018-05-19 DIAGNOSIS — M0579 Rheumatoid arthritis with rheumatoid factor of multiple sites without organ or systems involvement: Secondary | ICD-10-CM | POA: Diagnosis not present

## 2018-05-19 DIAGNOSIS — M720 Palmar fascial fibromatosis [Dupuytren]: Secondary | ICD-10-CM | POA: Diagnosis not present

## 2018-06-11 DIAGNOSIS — H2513 Age-related nuclear cataract, bilateral: Secondary | ICD-10-CM | POA: Diagnosis not present

## 2018-06-16 IMAGING — MG 2D DIGITAL SCREENING BILATERAL MAMMOGRAM WITH CAD AND ADJUNCT TO
9 of 12 series · 9 of 28 positions shown · non-contrast
Comparison: Previous exam(s).

CLINICAL DATA: Screening.

EXAM:
2D DIGITAL SCREENING BILATERAL MAMMOGRAM WITH CAD AND ADJUNCT TOMO

[L CC synth-2D]
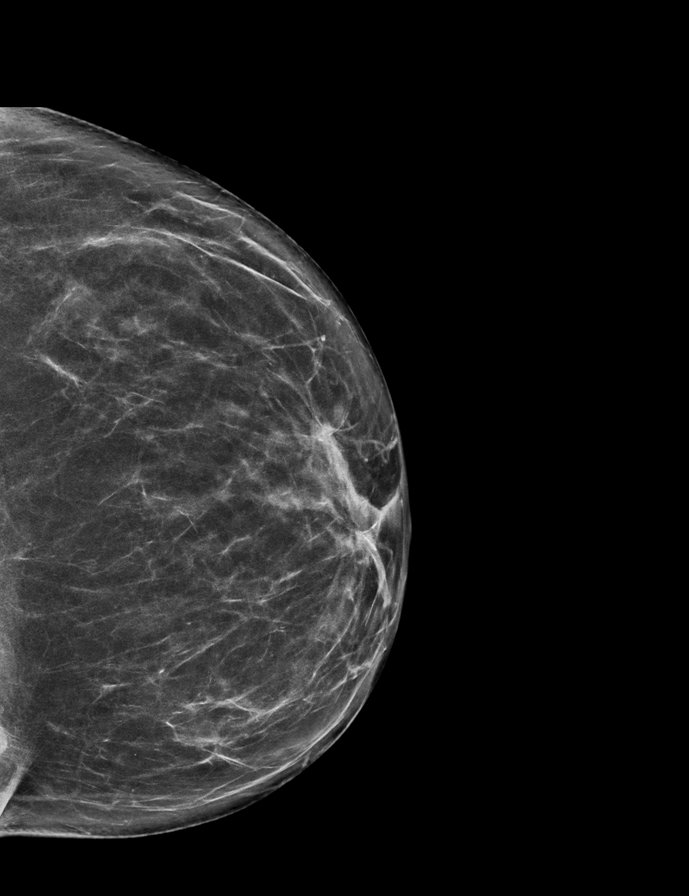

[L CC]
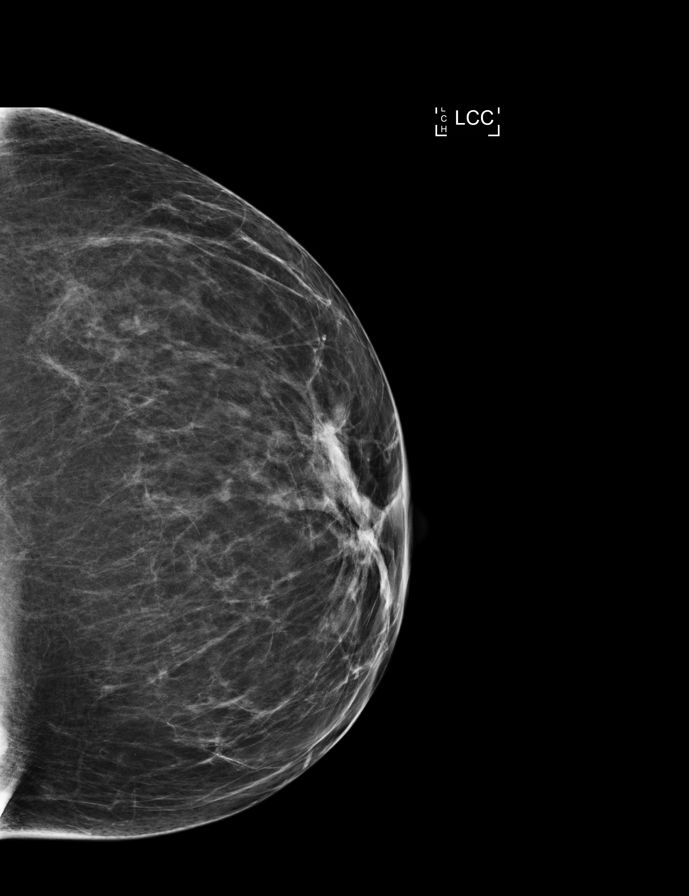

[R MLO]
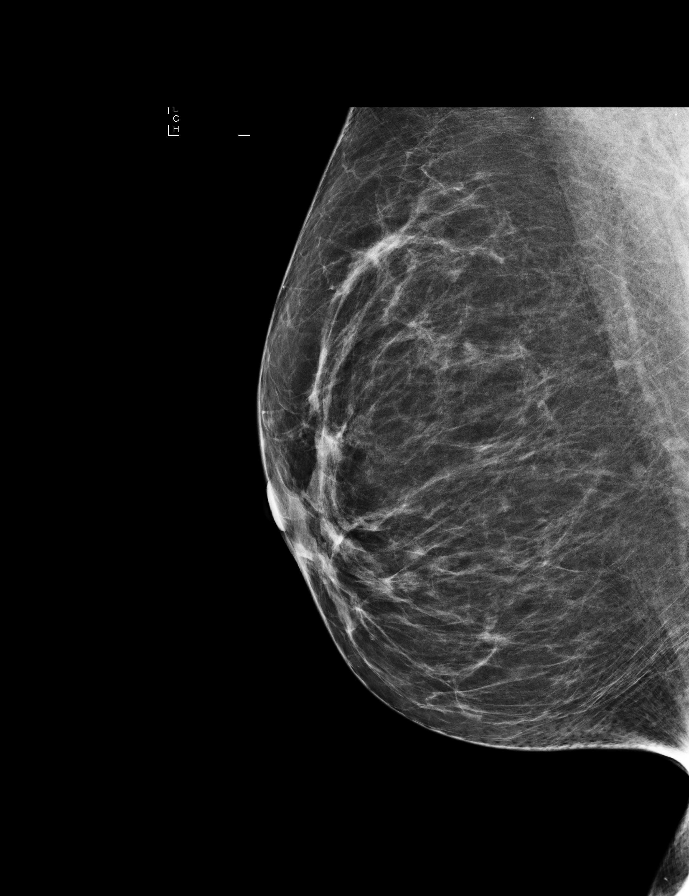

[L MLO]
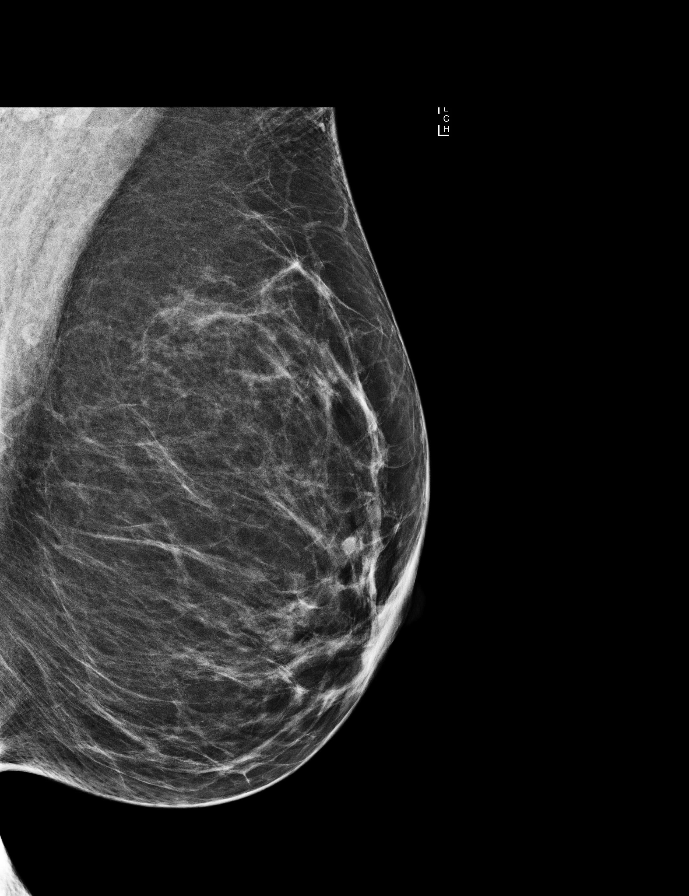

[R CC synth-2D]
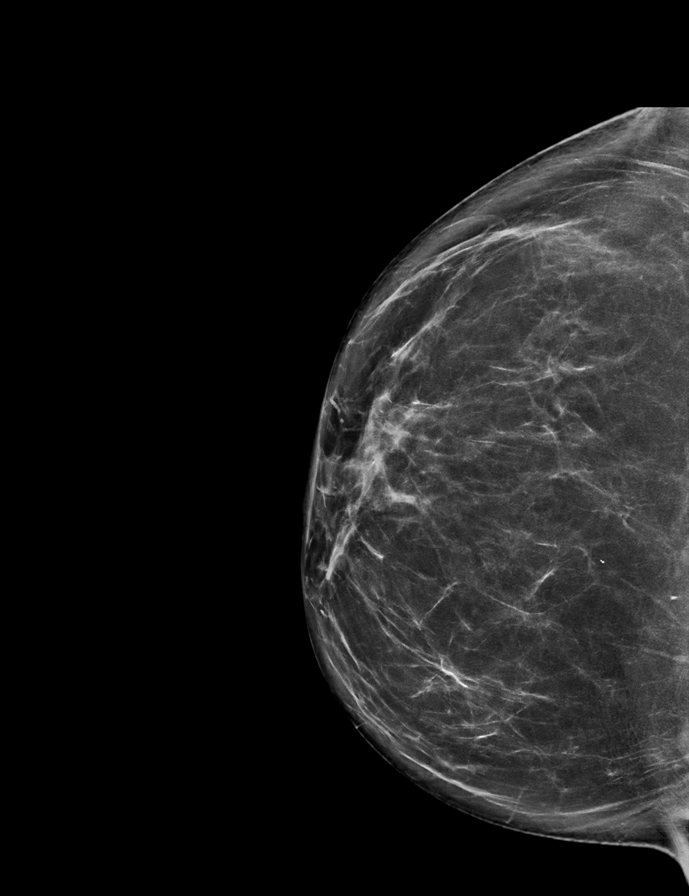

[L MLO synth-2D]
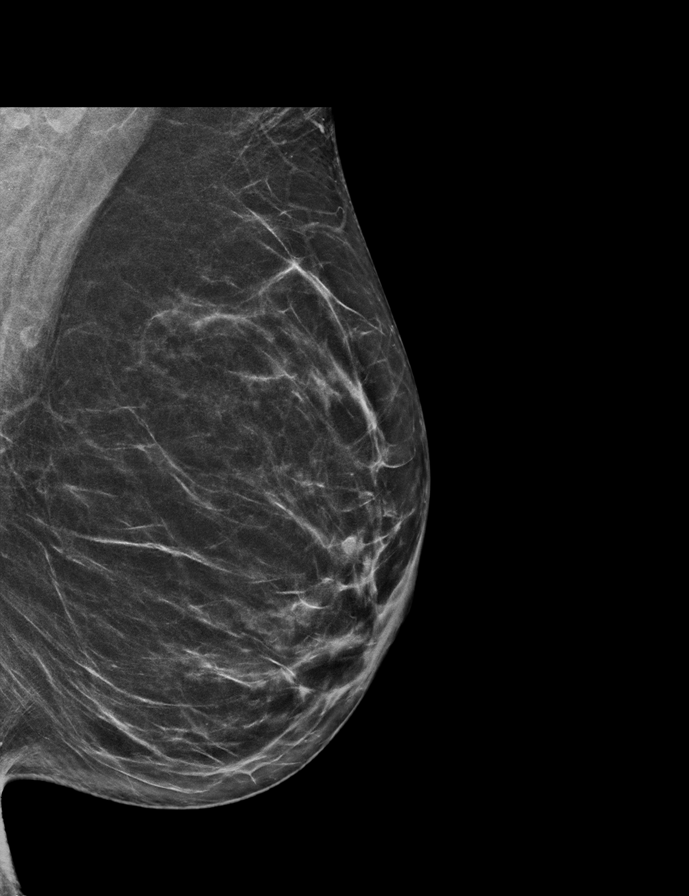

[R CC]
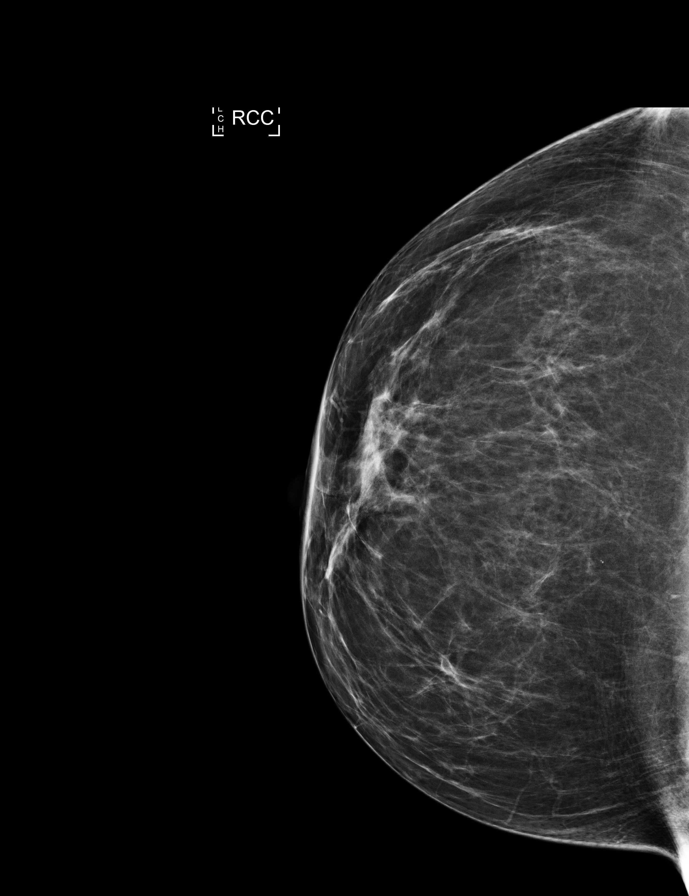

[R MLO synth-2D]
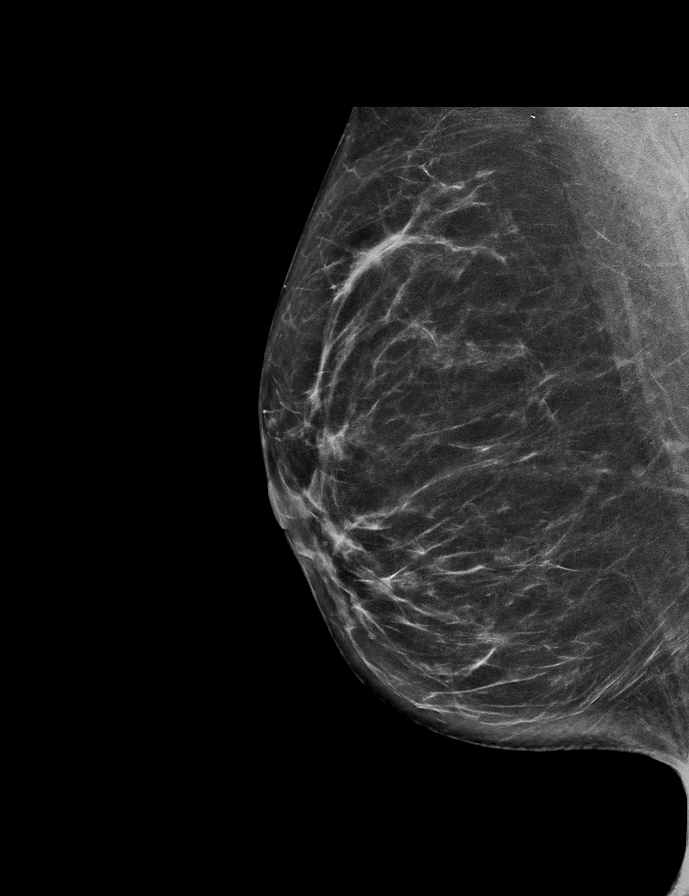

[L MLO tomo · tomo slice 31/61.0]
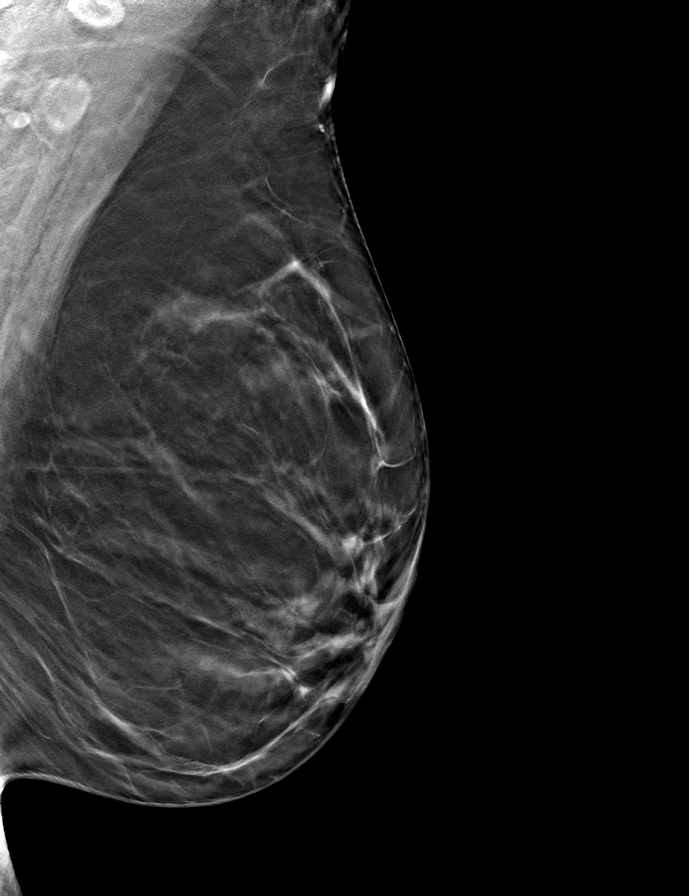

[9 of 28 positions shown; findings below may reference images not displayed]

ACR Breast Density Category b: There are scattered areas of
fibroglandular density.
FINDINGS: There are no findings suspicious for malignancy. Images were
processed with CAD.
IMPRESSION: No mammographic evidence of malignancy. A result letter of this
screening mammogram will be mailed directly to the patient.

RECOMMENDATION:
Screening mammogram in one year. (Code:97-6-RS4)

BI-RADS CATEGORY  1: Negative.

## 2018-06-28 ENCOUNTER — Other Ambulatory Visit: Payer: Self-pay | Admitting: Family Medicine

## 2018-06-28 DIAGNOSIS — Z1231 Encounter for screening mammogram for malignant neoplasm of breast: Secondary | ICD-10-CM

## 2018-07-09 DIAGNOSIS — L570 Actinic keratosis: Secondary | ICD-10-CM | POA: Diagnosis not present

## 2018-07-09 DIAGNOSIS — L578 Other skin changes due to chronic exposure to nonionizing radiation: Secondary | ICD-10-CM | POA: Diagnosis not present

## 2018-08-05 ENCOUNTER — Ambulatory Visit
Admission: RE | Admit: 2018-08-05 | Discharge: 2018-08-05 | Disposition: A | Discharge status: Home / Self Care | Payer: PPO | Source: Ambulatory Visit | Attending: Family Medicine | Admitting: Family Medicine

## 2018-08-05 DIAGNOSIS — Z1231 Encounter for screening mammogram for malignant neoplasm of breast: Secondary | ICD-10-CM | POA: Insufficient documentation

## 2018-08-30 DIAGNOSIS — L570 Actinic keratosis: Secondary | ICD-10-CM | POA: Diagnosis not present

## 2018-08-30 DIAGNOSIS — L578 Other skin changes due to chronic exposure to nonionizing radiation: Secondary | ICD-10-CM | POA: Diagnosis not present

## 2018-08-30 DIAGNOSIS — M0579 Rheumatoid arthritis with rheumatoid factor of multiple sites without organ or systems involvement: Secondary | ICD-10-CM | POA: Diagnosis not present

## 2018-09-01 IMAGING — US US CAROTID DUPLEX BILAT
1 series · 13 of 24 positions shown · non-contrast
Comparison: None.

CLINICAL DATA: 66-year-old female with anterior neck pain

EXAM:
BILATERAL CAROTID DUPLEX ULTRASOUND
TECHNIQUE: Gray scale imaging, color Doppler and duplex ultrasound were
performed of bilateral carotid and vertebral arteries in the neck.

[Series 1: us carotid duplex bilat · 0.06mm/px · 69 acquisitions, 13 frames shown]
[im 1/69]
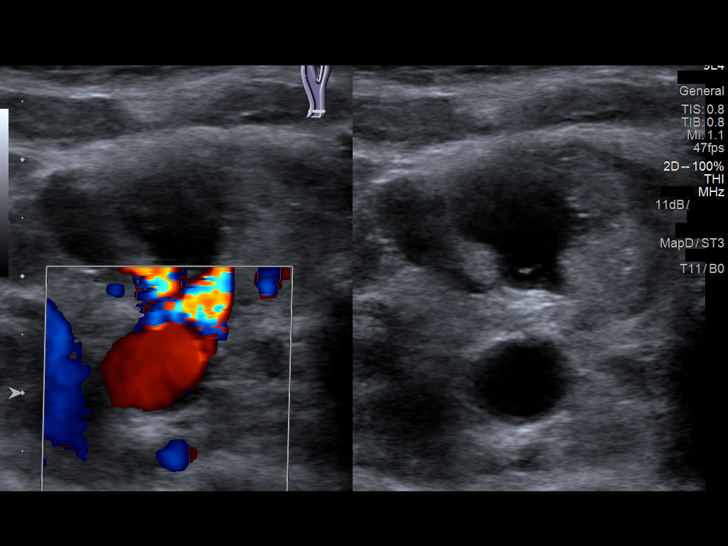
[im 6/69]
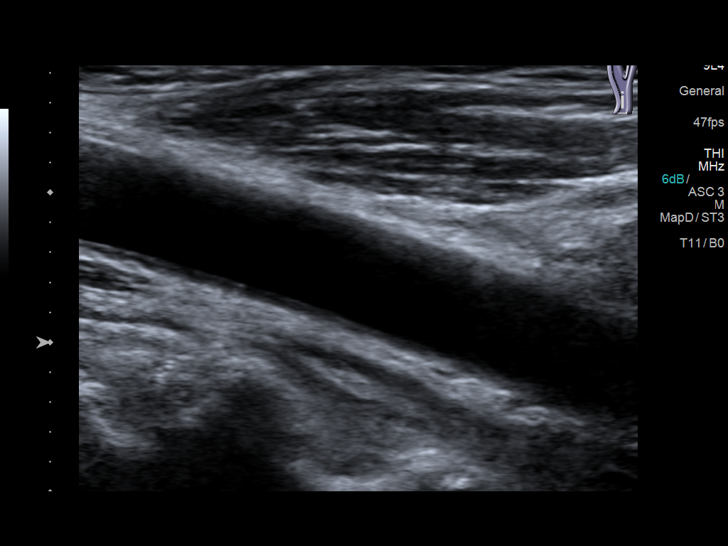
[im 12/69]
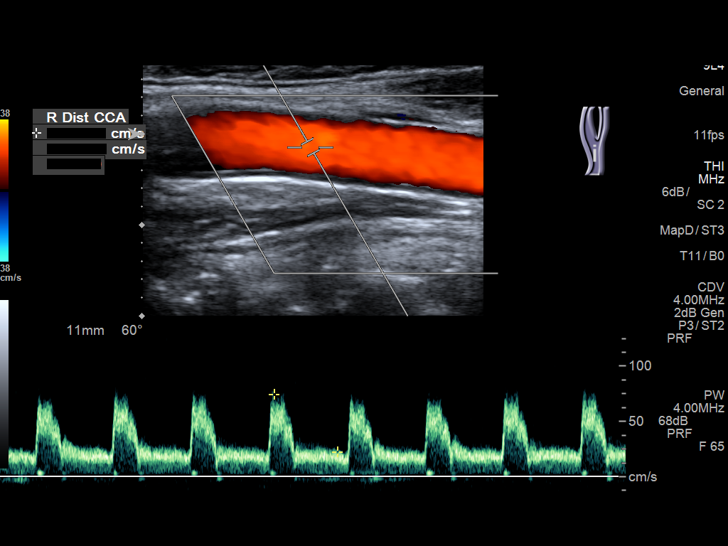
[im 18/69]
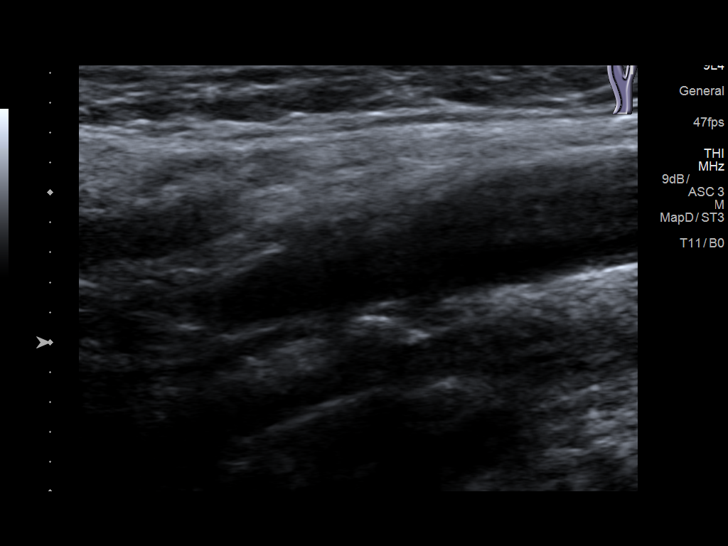
[im 24/69]
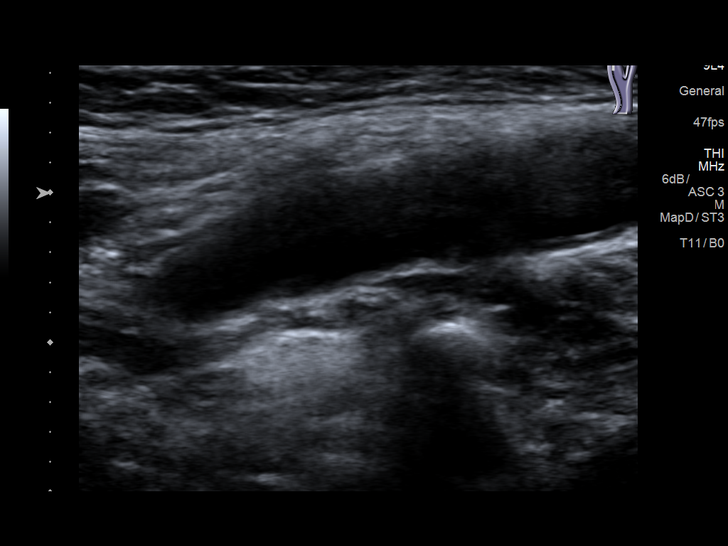
[im 30/69]
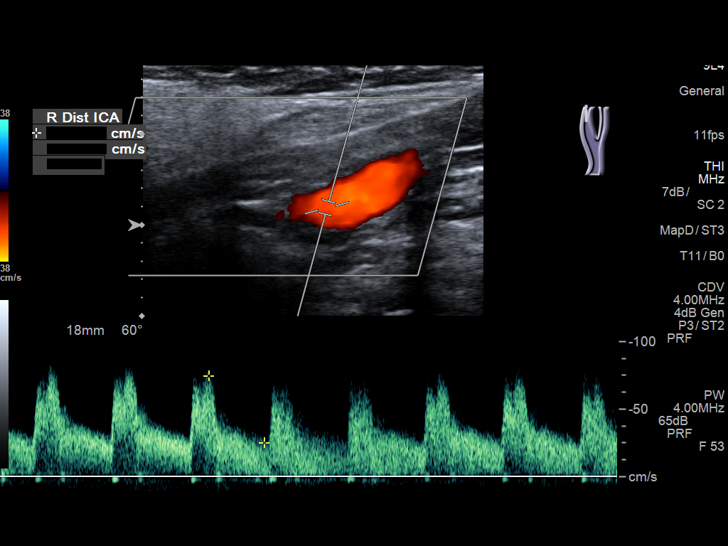
[im 39/69]
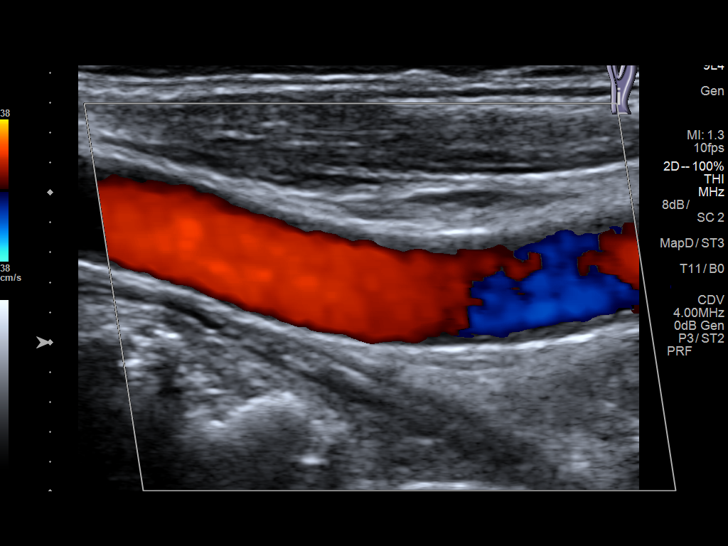
[im 42/69]
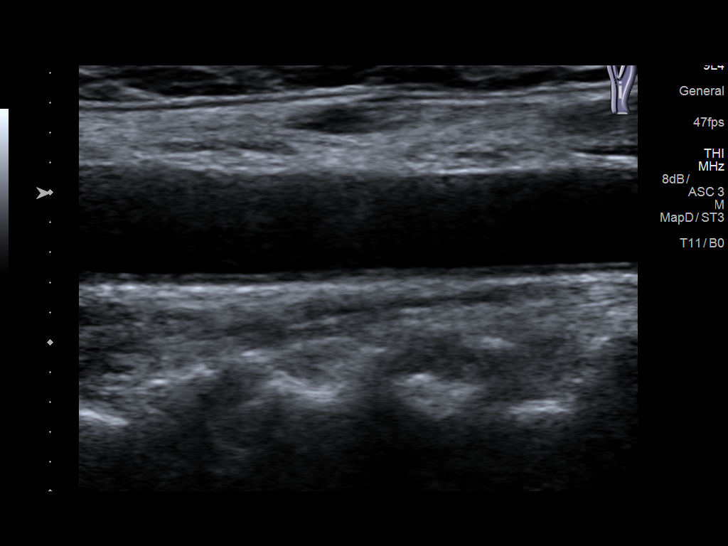
[im 48/69]
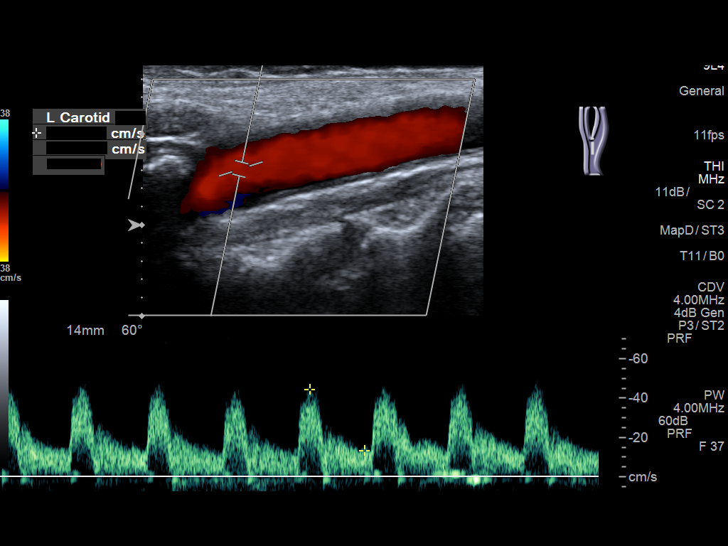
[im 54/69]
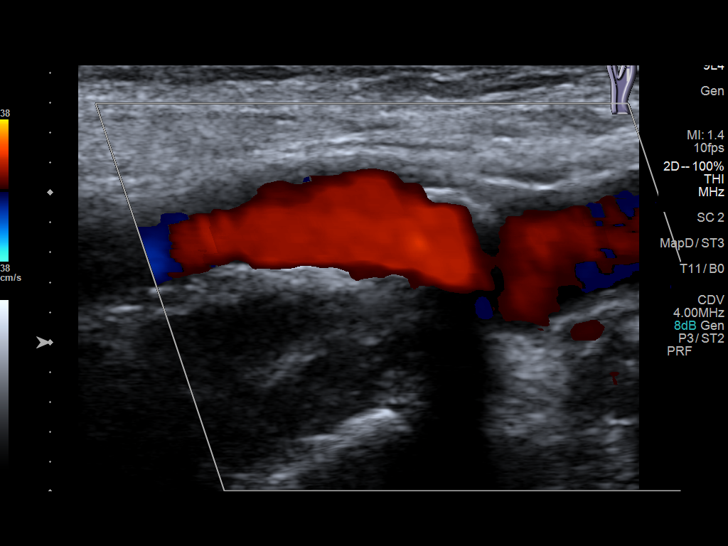
[im 60/69]
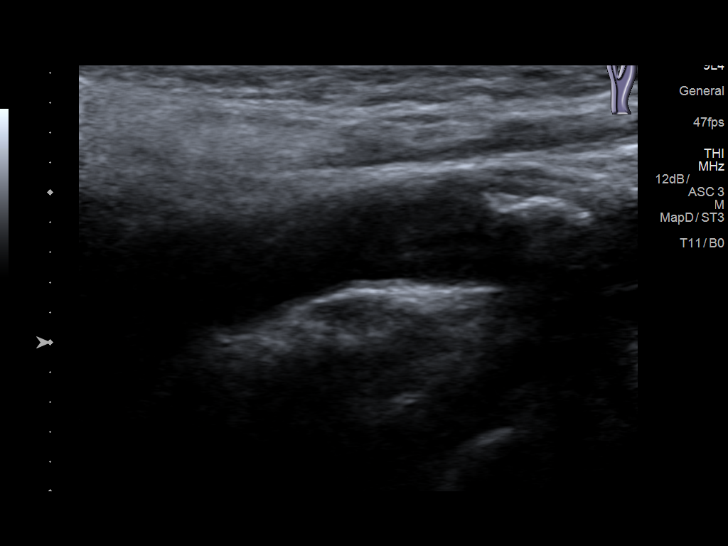
[im 66/69]
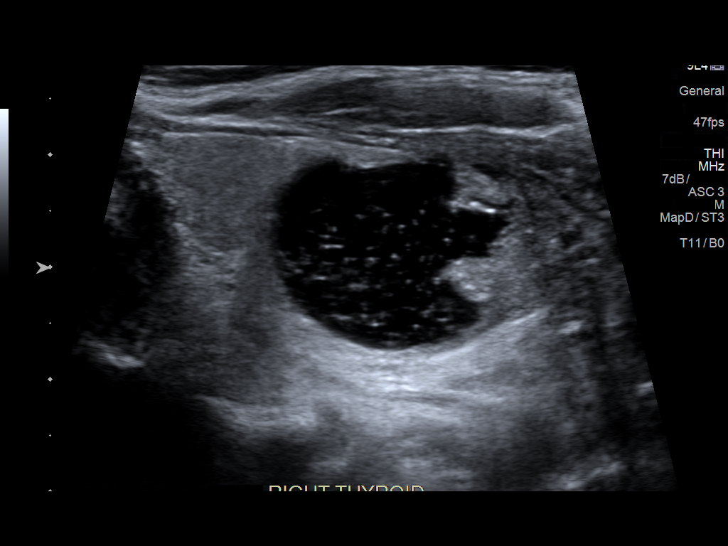
[im 69/69]
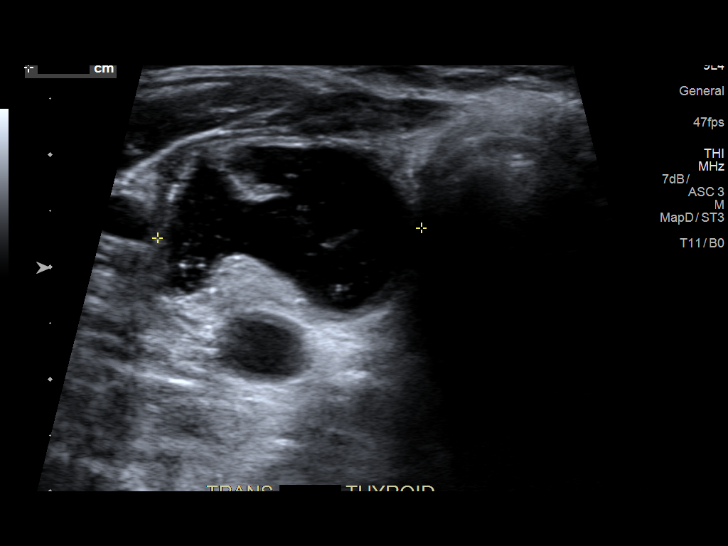

[13 of 24 positions shown; findings below may reference images not displayed]

FINDINGS: Criteria: Quantification of carotid stenosis is based on velocity
parameters that correlate the residual internal carotid diameter
with NASCET-based stenosis levels, using the diameter of the distal
internal carotid lumen as the denominator for stenosis measurement.

The following velocity measurements were obtained:

RIGHT

ICA:  75/25 cm/sec

CCA:  75/23 cm/sec

SYSTOLIC ICA/CCA RATIO:

DIASTOLIC ICA/CCA RATIO:

ECA:  56 cm/sec

LEFT

ICA:  83/28 cm/sec

CCA:  70/17 cm/sec

SYSTOLIC ICA/CCA RATIO:

DIASTOLIC ICA/CCA RATIO:

ECA:  60 cm/sec

RIGHT CAROTID ARTERY: Trace heterogeneous atherosclerotic plaque in
the proximal internal carotid artery. By peak systolic velocity
criteria, the estimated stenosis remains less than 50%.

RIGHT VERTEBRAL ARTERY:  Patent with normal antegrade flow.

LEFT CAROTID ARTERY: Trace heterogeneous plaque in the proximal
internal carotid artery. By peak systolic velocity criteria, the
estimated stenosis remains less than 50%.

LEFT VERTEBRAL ARTERY:  Patent with normal antegrade flow.

Other: Incidental imaging of the right thyroid gland demonstrates a
complex 2.6 x 1.8 x 2.4 cm cystic and solid nodule. The internal
contents are highly complex.
IMPRESSION: 1. Mild (1-49%) stenosis proximal right internal carotid artery
secondary to trace heterogeneous atherosclerotic plaque.
2. Mild (1-49%) stenosis proximal left internal carotid artery
secondary to trace heterogeneous atherosclerotic plaque.
3. Vertebral arteries are patent with normal antegrade flow.
4. Incidentally imaged complex mixed cystic and solid nodule in the
right thyroid gland. This may represent a degenerating colloid
nodule, or a hemorrhagic thyroid cyst. Consider further evaluation
with dedicated thyroid ultrasound.

## 2018-09-13 DIAGNOSIS — E041 Nontoxic single thyroid nodule: Secondary | ICD-10-CM | POA: Diagnosis not present

## 2018-09-16 DIAGNOSIS — E041 Nontoxic single thyroid nodule: Secondary | ICD-10-CM | POA: Diagnosis not present

## 2018-09-16 IMAGING — US US THYROID
1 series · 13 of 25 positions shown · non-contrast
Comparison: Carotid duplex ultrasound 10/20/2016

CLINICAL DATA: Prior ultrasound follow-up. 66-year-old female with
a thyroid cyst seen on prior carotid ultrasound.

EXAM:
THYROID ULTRASOUND
TECHNIQUE: Ultrasound examination of the thyroid gland and adjacent soft
tissues was performed.

[Series 1: us thyroid · 0.07mm/px · 13 of 47 slices shown]
[im 1/47]
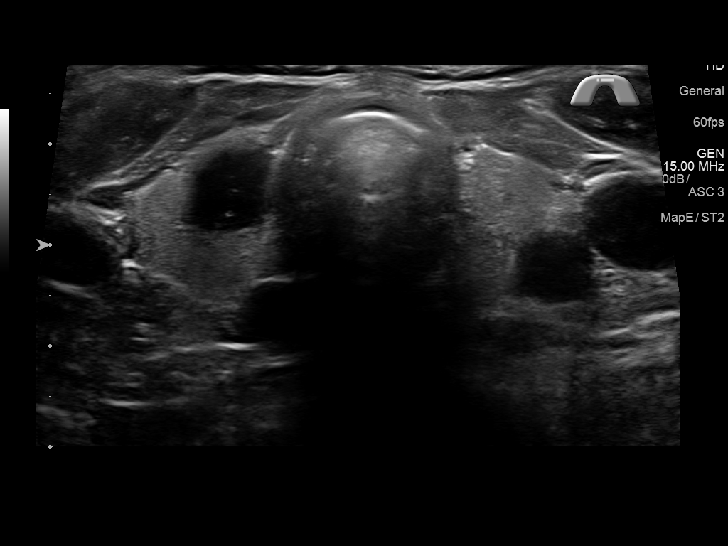
[im 4/47]
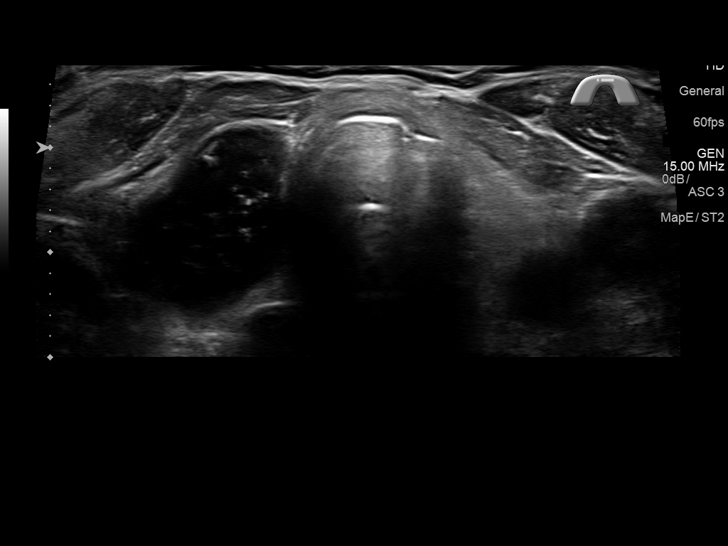
[im 8/47]
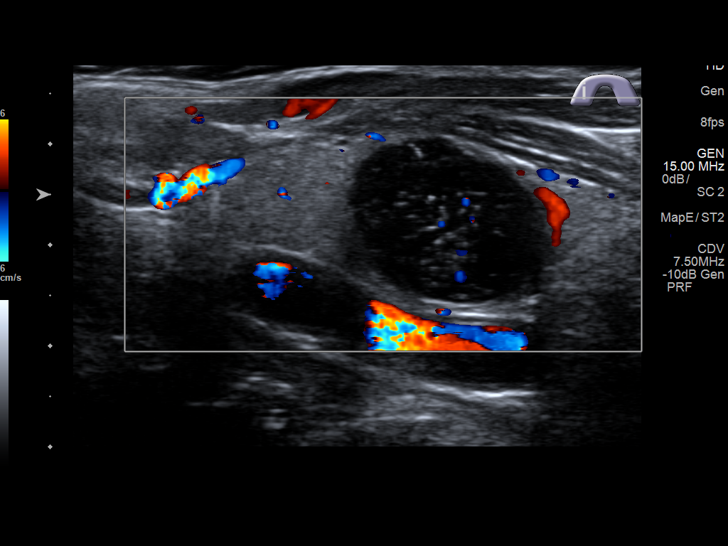
[im 12/47]
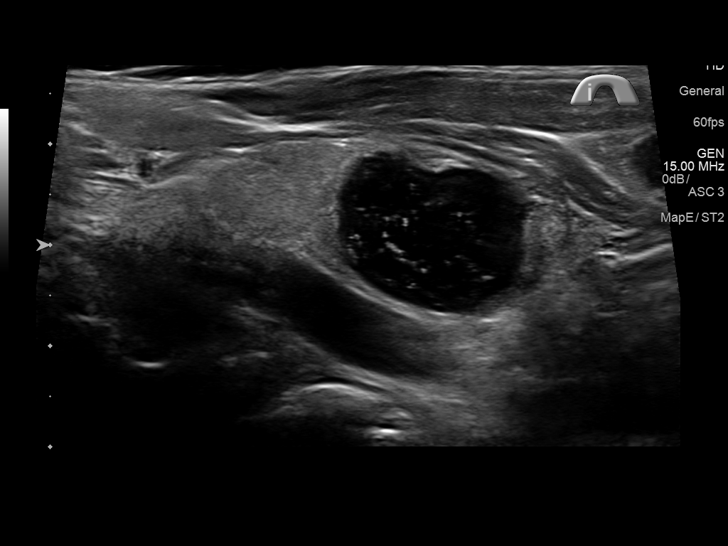
[im 16/47]
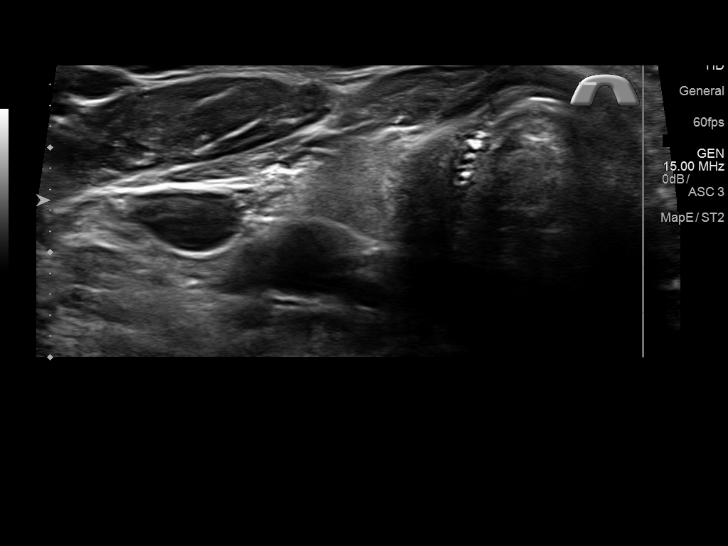
[im 20/47]
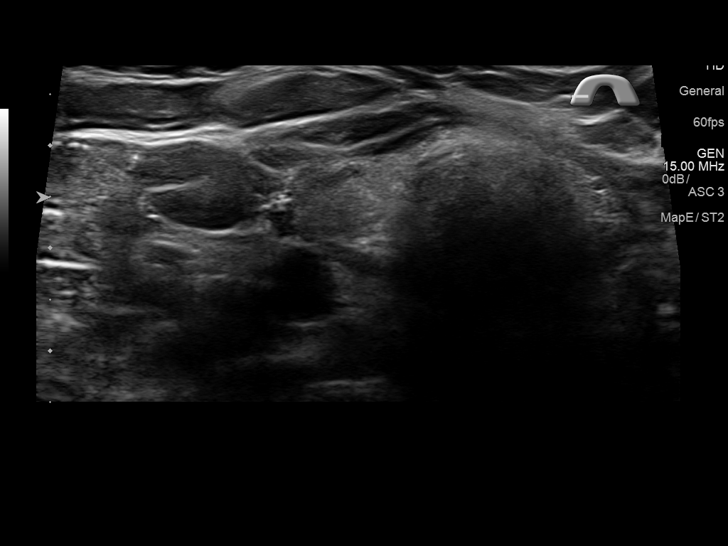
[im 24/47]
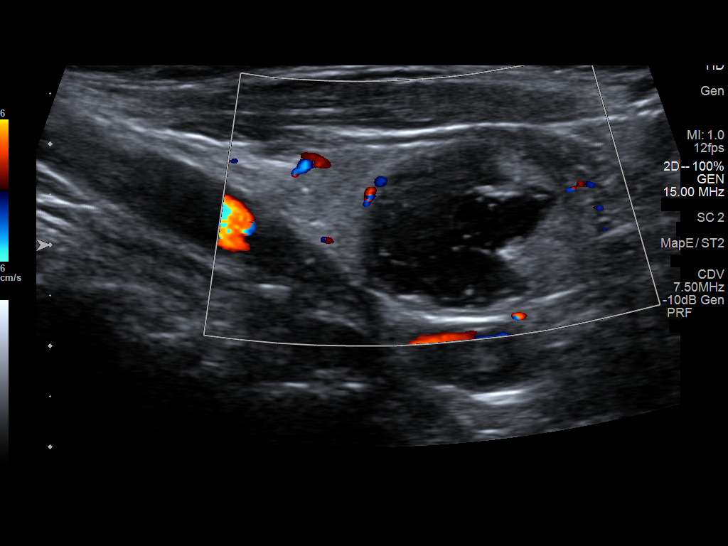
[im 27/47]
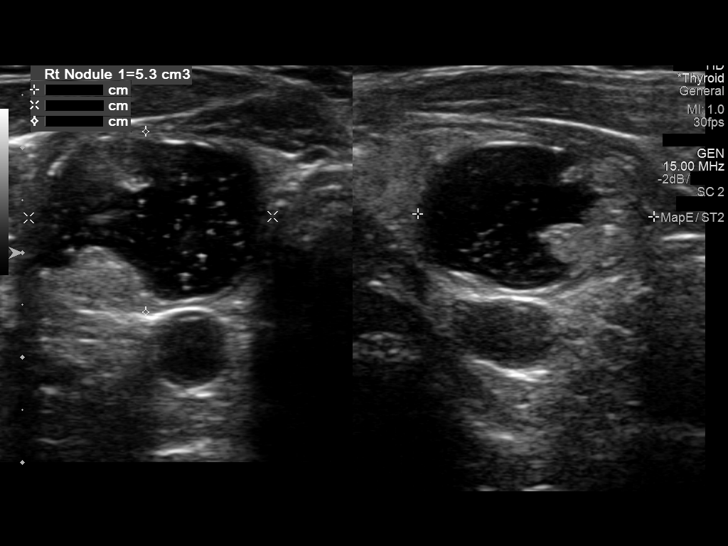
[im 31/47]
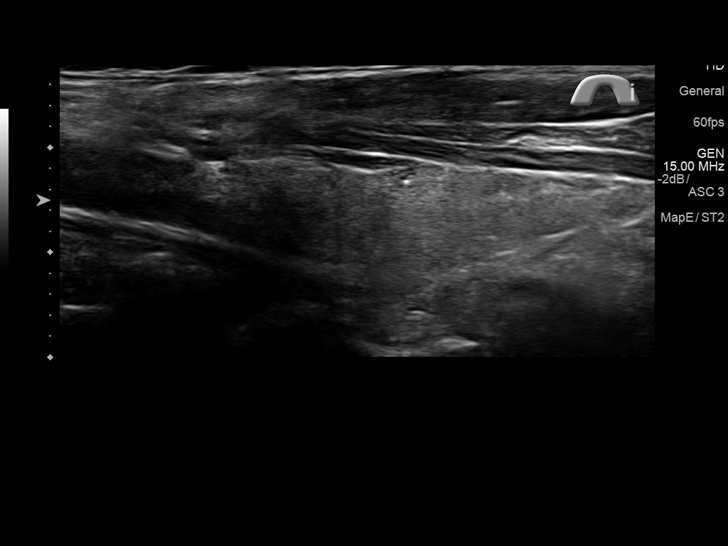
[im 35/47]
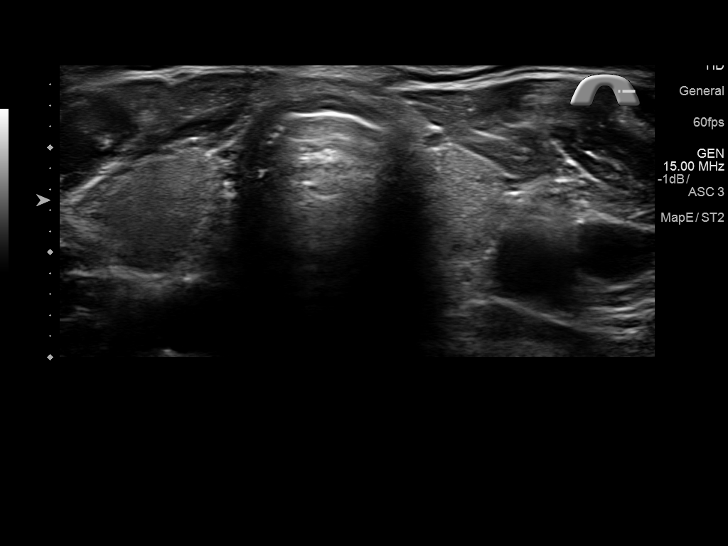
[im 39/47]
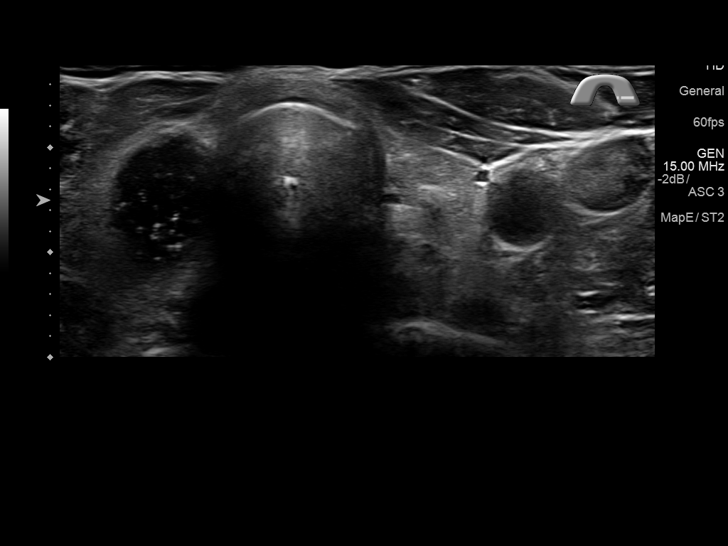
[im 43/47]
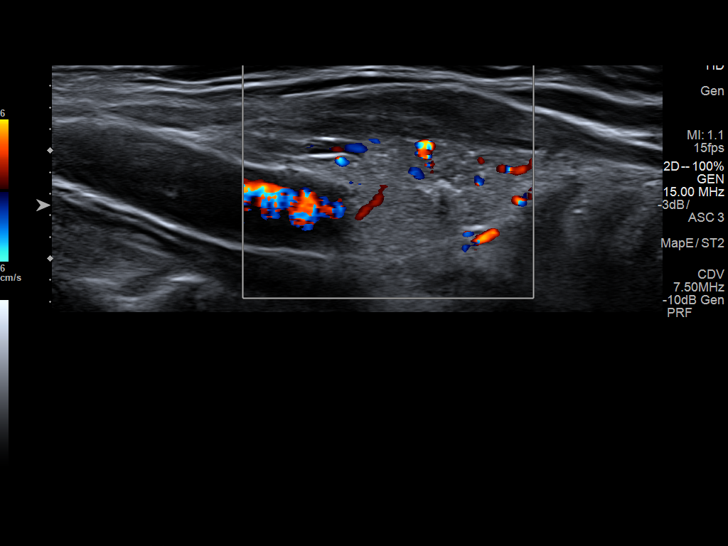
[im 47/47]
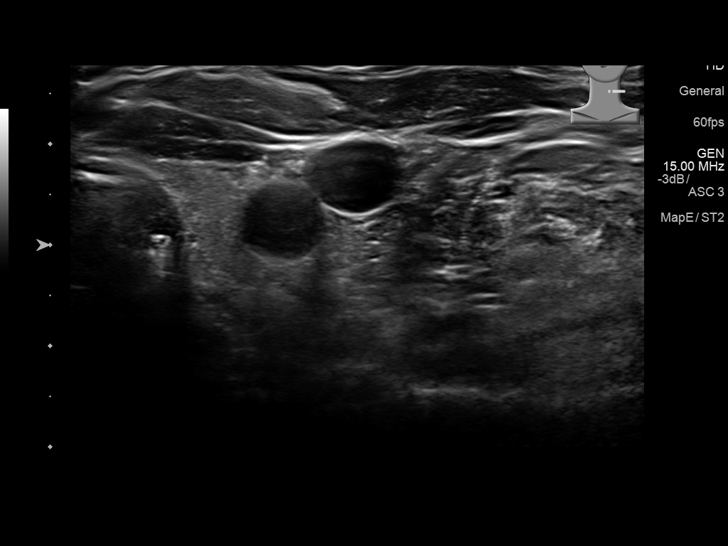

[13 of 25 positions shown; findings below may reference images not displayed]

FINDINGS: Parenchymal Echotexture: Mildly heterogenous

Isthmus: 0.2 cm

Right lobe: 4.1 x 2.5 x 1.7 cm

Left lobe: 3.0 x 1.4 x 0.8 cm

_________________________________________________________

Estimated total number of nodules >/= 1 cm: 1

Number of spongiform nodules >/=  2 cm not described below (TR1): 0

Number of mixed cystic and solid nodules >/= 1.5 cm not described
below (TR2): 0

_________________________________________________________

Nodule # 1:

Location: Right; Mid

Maximum size: 2.5 cm; Other 2 dimensions: 2.3 x 1.7 cm

Composition: mixed cystic and solid (1)

Echogenicity: isoechoic (1)

Shape: not taller-than-wide (0)

Margins: smooth (0)

Echogenic foci: none (0)

ACR TI-RADS total points: 2.

ACR TI-RADS risk category: TR2 (2 points).

ACR TI-RADS recommendations:

This nodule does NOT meet TI-RADS criteria for biopsy or dedicated
follow-up.

_________________________________________________________
IMPRESSION: Sonographically benign (TI-RADS category 2) mixed cystic and solid
nodule in the right mid gland. No further follow-up required.

The above is in keeping with the ACR TI-RADS recommendations - [HOSPITAL] 7234;[DATE].

## 2018-10-07 DIAGNOSIS — I10 Essential (primary) hypertension: Secondary | ICD-10-CM | POA: Diagnosis not present

## 2018-10-07 DIAGNOSIS — E785 Hyperlipidemia, unspecified: Secondary | ICD-10-CM | POA: Diagnosis not present

## 2018-10-12 DIAGNOSIS — L570 Actinic keratosis: Secondary | ICD-10-CM | POA: Diagnosis not present

## 2018-10-12 DIAGNOSIS — L578 Other skin changes due to chronic exposure to nonionizing radiation: Secondary | ICD-10-CM | POA: Diagnosis not present

## 2018-10-14 DIAGNOSIS — Z0000 Encounter for general adult medical examination without abnormal findings: Secondary | ICD-10-CM | POA: Diagnosis not present

## 2018-10-14 DIAGNOSIS — I10 Essential (primary) hypertension: Secondary | ICD-10-CM | POA: Diagnosis not present

## 2018-10-14 DIAGNOSIS — Z124 Encounter for screening for malignant neoplasm of cervix: Secondary | ICD-10-CM | POA: Diagnosis not present

## 2019-02-17 DIAGNOSIS — M0579 Rheumatoid arthritis with rheumatoid factor of multiple sites without organ or systems involvement: Secondary | ICD-10-CM | POA: Diagnosis not present

## 2019-02-24 DIAGNOSIS — Z79899 Other long term (current) drug therapy: Secondary | ICD-10-CM | POA: Diagnosis not present

## 2019-02-24 DIAGNOSIS — M0579 Rheumatoid arthritis with rheumatoid factor of multiple sites without organ or systems involvement: Secondary | ICD-10-CM | POA: Diagnosis not present

## 2019-02-24 DIAGNOSIS — M720 Palmar fascial fibromatosis [Dupuytren]: Secondary | ICD-10-CM | POA: Diagnosis not present

## 2019-03-14 DIAGNOSIS — M0579 Rheumatoid arthritis with rheumatoid factor of multiple sites without organ or systems involvement: Secondary | ICD-10-CM | POA: Diagnosis not present

## 2019-05-16 DIAGNOSIS — M0579 Rheumatoid arthritis with rheumatoid factor of multiple sites without organ or systems involvement: Secondary | ICD-10-CM | POA: Diagnosis not present

## 2019-05-23 DIAGNOSIS — Z1283 Encounter for screening for malignant neoplasm of skin: Secondary | ICD-10-CM | POA: Diagnosis not present

## 2019-05-23 DIAGNOSIS — L820 Inflamed seborrheic keratosis: Secondary | ICD-10-CM | POA: Diagnosis not present

## 2019-05-23 DIAGNOSIS — L570 Actinic keratosis: Secondary | ICD-10-CM | POA: Diagnosis not present

## 2019-05-23 DIAGNOSIS — D225 Melanocytic nevi of trunk: Secondary | ICD-10-CM | POA: Diagnosis not present

## 2019-05-23 DIAGNOSIS — D2260 Melanocytic nevi of unspecified upper limb, including shoulder: Secondary | ICD-10-CM | POA: Diagnosis not present

## 2019-05-26 DIAGNOSIS — M720 Palmar fascial fibromatosis [Dupuytren]: Secondary | ICD-10-CM | POA: Diagnosis not present

## 2019-05-26 DIAGNOSIS — Z79899 Other long term (current) drug therapy: Secondary | ICD-10-CM | POA: Diagnosis not present

## 2019-05-26 DIAGNOSIS — M0579 Rheumatoid arthritis with rheumatoid factor of multiple sites without organ or systems involvement: Secondary | ICD-10-CM | POA: Diagnosis not present

## 2019-06-30 DIAGNOSIS — Z79899 Other long term (current) drug therapy: Secondary | ICD-10-CM | POA: Diagnosis not present

## 2019-06-30 DIAGNOSIS — M0579 Rheumatoid arthritis with rheumatoid factor of multiple sites without organ or systems involvement: Secondary | ICD-10-CM | POA: Diagnosis not present

## 2019-08-18 DIAGNOSIS — M0579 Rheumatoid arthritis with rheumatoid factor of multiple sites without organ or systems involvement: Secondary | ICD-10-CM | POA: Diagnosis not present

## 2019-08-18 DIAGNOSIS — M720 Palmar fascial fibromatosis [Dupuytren]: Secondary | ICD-10-CM | POA: Diagnosis not present

## 2019-08-18 DIAGNOSIS — Z79899 Other long term (current) drug therapy: Secondary | ICD-10-CM | POA: Diagnosis not present

## 2019-09-01 DIAGNOSIS — D702 Other drug-induced agranulocytosis: Secondary | ICD-10-CM | POA: Diagnosis not present

## 2019-09-01 DIAGNOSIS — Z79899 Other long term (current) drug therapy: Secondary | ICD-10-CM | POA: Diagnosis not present

## 2019-09-01 DIAGNOSIS — M720 Palmar fascial fibromatosis [Dupuytren]: Secondary | ICD-10-CM | POA: Diagnosis not present

## 2019-09-01 DIAGNOSIS — M0579 Rheumatoid arthritis with rheumatoid factor of multiple sites without organ or systems involvement: Secondary | ICD-10-CM | POA: Diagnosis not present

## 2019-09-05 ENCOUNTER — Other Ambulatory Visit: Payer: Self-pay | Admitting: Family Medicine

## 2019-09-05 DIAGNOSIS — Z1231 Encounter for screening mammogram for malignant neoplasm of breast: Secondary | ICD-10-CM

## 2019-11-21 ENCOUNTER — Other Ambulatory Visit: Payer: Self-pay

## 2019-11-21 ENCOUNTER — Ambulatory Visit: Payer: PPO | Admitting: Dermatology

## 2019-11-21 DIAGNOSIS — L570 Actinic keratosis: Secondary | ICD-10-CM

## 2019-11-21 DIAGNOSIS — L578 Other skin changes due to chronic exposure to nonionizing radiation: Secondary | ICD-10-CM | POA: Diagnosis not present

## 2019-11-21 DIAGNOSIS — L708 Other acne: Secondary | ICD-10-CM

## 2019-11-21 NOTE — Progress Notes (Addendum)
   Follow-Up Visit   Subjective  Deborah Singh is a 69 y.o. female who presents for the following: AK f/u (scalp, LN2 in past, ~5m used 5FU/Calcipotriene sol bid x 2wks) and check bump (R ear x 2wks, no symptoms).    The following portions of the chart were reviewed this encounter and updated as appropriate:     Review of Systems: No other skin or systemic complaints.  Objective  Well appearing patient in no apparent distress; mood and affect are within normal limits.  A focused examination was performed including face, scalp, right ear. Relevant physical exam findings are noted in the Assessment and Plan.  Objective  Scalp (4): Mild scaling of scalp. Pink/brown scaly macule of crown x 3, frontal hairline x 1.  Objective  Head - Anterior (Face): Actinic changes  Objective  Right ear canal: Indistinct light pink flat papule c/w inflammatory papule  Assessment & Plan  AK (actinic keratosis) (4) Scalp  Destruction of lesion - Scalp  Destruction method: cryotherapy   Informed consent: discussed and consent obtained   Lesion destroyed using liquid nitrogen: Yes   Region frozen until ice ball extended beyond lesion: Yes   Outcome: patient tolerated procedure well with no complications   Post-procedure details: wound care instructions given    Actinic skin damage Head - Anterior (Face)  Recommend daily use of broad spectrum spf 30+ sunscreen.   Other acne Right ear canal  Cloderm cream bid - sample given x 1. Lot #[id number] Exp 07/2021  Return in about 6 months (around 05/23/2020) for as scheduled for TBSE.   I, Ashok Cordia, CMA, am acting as scribe for Brendolyn Patty, MD .

## 2019-11-21 NOTE — Patient Instructions (Signed)

## 2019-11-22 NOTE — Addendum Note (Signed)
Addended by: Brendolyn Patty on: 11/22/2019 02:55 PM   Modules accepted: Level of Service

## 2019-11-30 ENCOUNTER — Ambulatory Visit
Admission: RE | Admit: 2019-11-30 | Discharge: 2019-11-30 | Disposition: A | Discharge status: Home / Self Care | Payer: PPO | Source: Ambulatory Visit | Attending: Family Medicine | Admitting: Family Medicine

## 2019-11-30 DIAGNOSIS — M0579 Rheumatoid arthritis with rheumatoid factor of multiple sites without organ or systems involvement: Secondary | ICD-10-CM | POA: Diagnosis not present

## 2019-11-30 DIAGNOSIS — Z1231 Encounter for screening mammogram for malignant neoplasm of breast: Secondary | ICD-10-CM | POA: Diagnosis not present

## 2019-11-30 DIAGNOSIS — Z79899 Other long term (current) drug therapy: Secondary | ICD-10-CM | POA: Diagnosis not present

## 2019-12-13 DIAGNOSIS — E785 Hyperlipidemia, unspecified: Secondary | ICD-10-CM | POA: Diagnosis not present

## 2019-12-13 DIAGNOSIS — Z0000 Encounter for general adult medical examination without abnormal findings: Secondary | ICD-10-CM | POA: Diagnosis not present

## 2019-12-19 DIAGNOSIS — E785 Hyperlipidemia, unspecified: Secondary | ICD-10-CM | POA: Diagnosis not present

## 2019-12-19 DIAGNOSIS — I10 Essential (primary) hypertension: Secondary | ICD-10-CM | POA: Diagnosis not present

## 2019-12-19 DIAGNOSIS — Z0000 Encounter for general adult medical examination without abnormal findings: Secondary | ICD-10-CM | POA: Diagnosis not present

## 2019-12-19 DIAGNOSIS — K219 Gastro-esophageal reflux disease without esophagitis: Secondary | ICD-10-CM | POA: Diagnosis not present

## 2019-12-19 DIAGNOSIS — Z124 Encounter for screening for malignant neoplasm of cervix: Secondary | ICD-10-CM | POA: Diagnosis not present

## 2020-02-23 DIAGNOSIS — Z79899 Other long term (current) drug therapy: Secondary | ICD-10-CM | POA: Diagnosis not present

## 2020-02-23 DIAGNOSIS — M0579 Rheumatoid arthritis with rheumatoid factor of multiple sites without organ or systems involvement: Secondary | ICD-10-CM | POA: Diagnosis not present

## 2020-03-13 DIAGNOSIS — M720 Palmar fascial fibromatosis [Dupuytren]: Secondary | ICD-10-CM | POA: Diagnosis not present

## 2020-03-13 DIAGNOSIS — M0579 Rheumatoid arthritis with rheumatoid factor of multiple sites without organ or systems involvement: Secondary | ICD-10-CM | POA: Diagnosis not present

## 2020-03-13 DIAGNOSIS — D709 Neutropenia, unspecified: Secondary | ICD-10-CM | POA: Diagnosis not present

## 2020-03-13 DIAGNOSIS — Z79899 Other long term (current) drug therapy: Secondary | ICD-10-CM | POA: Diagnosis not present

## 2020-03-15 ENCOUNTER — Other Ambulatory Visit: Payer: Self-pay | Admitting: Rheumatology

## 2020-03-15 DIAGNOSIS — D709 Neutropenia, unspecified: Secondary | ICD-10-CM

## 2020-03-21 ENCOUNTER — Other Ambulatory Visit: Payer: Self-pay

## 2020-03-21 ENCOUNTER — Ambulatory Visit
Admission: RE | Admit: 2020-03-21 | Discharge: 2020-03-21 | Disposition: A | Discharge status: Home / Self Care | Payer: PPO | Source: Ambulatory Visit | Attending: Rheumatology | Admitting: Rheumatology

## 2020-03-21 DIAGNOSIS — N281 Cyst of kidney, acquired: Secondary | ICD-10-CM | POA: Diagnosis not present

## 2020-03-21 DIAGNOSIS — D709 Neutropenia, unspecified: Secondary | ICD-10-CM | POA: Insufficient documentation

## 2020-03-21 DIAGNOSIS — N2889 Other specified disorders of kidney and ureter: Secondary | ICD-10-CM | POA: Diagnosis not present

## 2020-03-28 ENCOUNTER — Other Ambulatory Visit: Payer: Self-pay | Admitting: Rheumatology

## 2020-03-28 DIAGNOSIS — N2889 Other specified disorders of kidney and ureter: Secondary | ICD-10-CM

## 2020-04-06 ENCOUNTER — Other Ambulatory Visit: Payer: Self-pay

## 2020-04-06 ENCOUNTER — Ambulatory Visit
Admission: RE | Admit: 2020-04-06 | Discharge: 2020-04-06 | Disposition: A | Discharge status: Home / Self Care | Payer: PPO | Source: Ambulatory Visit | Attending: Rheumatology | Admitting: Rheumatology

## 2020-04-06 DIAGNOSIS — D1771 Benign lipomatous neoplasm of kidney: Secondary | ICD-10-CM | POA: Diagnosis not present

## 2020-04-06 DIAGNOSIS — N2889 Other specified disorders of kidney and ureter: Secondary | ICD-10-CM | POA: Diagnosis not present

## 2020-04-06 DIAGNOSIS — I700 Atherosclerosis of aorta: Secondary | ICD-10-CM | POA: Diagnosis not present

## 2020-04-06 DIAGNOSIS — N281 Cyst of kidney, acquired: Secondary | ICD-10-CM | POA: Diagnosis not present

## 2020-04-06 LAB — POCT I-STAT CREATININE: Creatinine, Ser: 0.9 mg/dL (ref 0.44–1.00)

## 2020-04-06 MED ORDER — IOHEXOL 300 MG/ML  SOLN
85.0000 mL | Freq: Once | INTRAMUSCULAR | Status: AC | PRN
  Administered 2020-04-06: 85 mL via INTRAVENOUS

## 2020-05-28 ENCOUNTER — Ambulatory Visit: Payer: PPO | Admitting: Dermatology

## 2020-05-28 ENCOUNTER — Other Ambulatory Visit: Payer: Self-pay

## 2020-05-28 DIAGNOSIS — Q825 Congenital non-neoplastic nevus: Secondary | ICD-10-CM

## 2020-05-28 DIAGNOSIS — L821 Other seborrheic keratosis: Secondary | ICD-10-CM | POA: Diagnosis not present

## 2020-05-28 DIAGNOSIS — D229 Melanocytic nevi, unspecified: Secondary | ICD-10-CM

## 2020-05-28 DIAGNOSIS — L570 Actinic keratosis: Secondary | ICD-10-CM

## 2020-05-28 DIAGNOSIS — L578 Other skin changes due to chronic exposure to nonionizing radiation: Secondary | ICD-10-CM

## 2020-05-28 DIAGNOSIS — B351 Tinea unguium: Secondary | ICD-10-CM

## 2020-05-28 DIAGNOSIS — L700 Acne vulgaris: Secondary | ICD-10-CM | POA: Diagnosis not present

## 2020-05-28 DIAGNOSIS — Q828 Other specified congenital malformations of skin: Secondary | ICD-10-CM

## 2020-05-28 DIAGNOSIS — L820 Inflamed seborrheic keratosis: Secondary | ICD-10-CM

## 2020-05-28 DIAGNOSIS — L814 Other melanin hyperpigmentation: Secondary | ICD-10-CM | POA: Diagnosis not present

## 2020-05-28 DIAGNOSIS — D225 Melanocytic nevi of trunk: Secondary | ICD-10-CM | POA: Diagnosis not present

## 2020-05-28 DIAGNOSIS — Z1283 Encounter for screening for malignant neoplasm of skin: Secondary | ICD-10-CM | POA: Diagnosis not present

## 2020-05-28 MED ORDER — JUBLIA 10 % EX SOLN: CUTANEOUS | 2 refills | Status: DC

## 2020-05-28 MED ORDER — FLUOROURACIL 5 % EX SOLN: CUTANEOUS | 1 refills | Status: DC

## 2020-05-28 MED ORDER — CALCIPOTRIENE 0.005 % EX SOLN: CUTANEOUS | 0 refills | Status: DC

## 2020-05-28 MED ORDER — CLINDAMYCIN PHOS-BENZOYL PEROX 1-5 % EX GEL: CUTANEOUS | 3 refills | Status: DC

## 2020-05-28 NOTE — Progress Notes (Signed)
Follow-Up Visit   Subjective  Deborah Singh is a 69 y.o. female who presents for the following: Annual Exam (TBSE. Hx AKs of the scalp. Has used 5FU Solution and Calcipotriene solution in the past with good results. She would like another Rx of each to treat with again this winter.). When her meds were examined, it was discovered she was given fluocinonide solution instead of 5 FU solution.  She noted that she didn't get any response when she tried it with the Calcipotriene solution.  She has a new scaly spot in the scalp and a a couple irritated spots on her L elbow that she picks at.   The following portions of the chart were reviewed this encounter and updated as appropriate:      Review of Systems:  No other skin or systemic complaints except as noted in HPI or Assessment and Plan.  Objective  Well appearing patient in no apparent distress; mood and affect are within normal limits.  A full examination was performed including scalp, head, eyes, ears, nose, lips, neck, chest, axillae, abdomen, back, buttocks, bilateral upper extremities, bilateral lower extremities, hands, feet, fingers, toes, fingernails, and toenails. All findings within normal limits unless otherwise noted below.  Objective  Right Upper Arm: Violaceous speckled small plaque.  Objective  Right Pretibia: 4.3 x 3.8 cm waxy flesh patch with keratotic rim.  Objective  Face: Inflammatory papules on left nasaolabial, R perioral , R chin.  Left Lateral Abdomen; Right Lower Flank; Right Lower Abdomen  Objective  Right Lower Flank: 3 x 1 mm med dark brown macule  Left Lateral Abdomen: 2 mm med dark brown macule  Right Lower Abdomen: 4 x 3 mm speckled brown macule  Objective  R occipital scalp x 1: Erythematous thin papules/macules with gritty scale. Crown and vertex scalp clear.  Objective  Left Upper Elbow x 2 (2): Erythematous keratotic or waxy stuck-on papule   Objective  Right 3rd  Toenail: White discoloration with onycholysis.   Assessment & Plan   Skin cancer screening performed today.  Actinic Damage - diffuse scaly erythematous macules with underlying dyspigmentation - Recommend daily broad spectrum sunscreen SPF 30+ to sun-exposed areas, reapply every 2 hours as needed.  - Call for new or changing lesions.  Melanocytic Nevi - Tan-brown and/or pink-flesh-colored symmetric macules and papules - Benign appearing on exam today - Observation - Call clinic for new or changing moles - Recommend daily use of broad spectrum spf 30+ sunscreen to sun-exposed areas.   Lentigines - Scattered tan macules, including waxy flesh patch on left lower calf - Discussed due to sun exposure - Benign, observe - Call for any changes  Seborrheic Keratoses - Stuck-on, waxy, tan-brown papules and plaques  - Discussed benign etiology and prognosis. - Observe - Call for any changes  Vascular birthmark Right Upper Arm  Benign, observe.      Porokeratosis Right Pretibia  Benign, observe.    Acne vulgaris Face  clindamycin-benzoyl peroxide (BENZACLIN WITH PUMP) gel - Face  Nevus (3) Left Lateral Abdomen; Right Lower Flank; Right Lower Abdomen  Benign-appearing.  Observation.  Call clinic for new or changing moles.  Recommend daily use of broad spectrum spf 30+ sunscreen to sun-exposed areas.    AK (actinic keratosis) R occipital scalp x 1  Will restart this winter:  5FU Solution Apply to AA scalp BID x 2 weeks. Calcipotriene solution 0.005% Apply to AA scalp BID x 2 weeks.  Recommend wearing hat when outdoors.  Destruction  of lesion - R occipital scalp x 1  Destruction method: cryotherapy   Informed consent: discussed and consent obtained   Lesion destroyed using liquid nitrogen: Yes   Region frozen until ice ball extended beyond lesion: Yes   Outcome: patient tolerated procedure well with no complications   Post-procedure details: wound care  instructions given    Fluorouracil 5 % SOLN - R occipital scalp x 1  Calcipotriene 0.005 % solution - R occipital scalp x 1  Inflamed seborrheic keratosis (2) Left Upper Elbow x 2  Cryotherapy today  Destruction of lesion - Left Upper Elbow x 2  Destruction method: cryotherapy   Informed consent: discussed and consent obtained   Lesion destroyed using liquid nitrogen: Yes   Region frozen until ice ball extended beyond lesion: Yes   Outcome: patient tolerated procedure well with no complications   Post-procedure details: wound care instructions given    Tinea unguium Right 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  Patient advised with can take 6-8 months to improve.  Don't recommend Lamisil as patient is on methotrexate for RA.  Efinaconazole (JUBLIA) 10 % SOLN - Right 3rd Toenail  Return in about 1 year (around 05/28/2021) for TBSE.   IJamesetta Orleans, CMA, am acting as scribe for Brendolyn Patty, MD .  Documentation: I have reviewed the above documentation for accuracy and completeness, and I agree with the above.  Brendolyn Patty MD

## 2020-05-28 NOTE — Patient Instructions (Addendum)
Cryotherapy Aftercare  . Wash gently with soap and water everyday.   . Apply Vaseline and Band-Aid daily until healed.  

## 2020-06-13 DIAGNOSIS — Z79899 Other long term (current) drug therapy: Secondary | ICD-10-CM | POA: Diagnosis not present

## 2020-06-13 DIAGNOSIS — M0579 Rheumatoid arthritis with rheumatoid factor of multiple sites without organ or systems involvement: Secondary | ICD-10-CM | POA: Diagnosis not present

## 2020-09-06 DIAGNOSIS — Z79899 Other long term (current) drug therapy: Secondary | ICD-10-CM | POA: Diagnosis not present

## 2020-09-06 DIAGNOSIS — M0579 Rheumatoid arthritis with rheumatoid factor of multiple sites without organ or systems involvement: Secondary | ICD-10-CM | POA: Diagnosis not present

## 2020-09-15 ENCOUNTER — Other Ambulatory Visit: Payer: PPO

## 2020-09-15 DIAGNOSIS — Z20822 Contact with and (suspected) exposure to covid-19: Secondary | ICD-10-CM | POA: Diagnosis not present

## 2020-09-18 DIAGNOSIS — M0579 Rheumatoid arthritis with rheumatoid factor of multiple sites without organ or systems involvement: Secondary | ICD-10-CM | POA: Diagnosis not present

## 2020-09-18 DIAGNOSIS — G8929 Other chronic pain: Secondary | ICD-10-CM | POA: Diagnosis not present

## 2020-09-18 DIAGNOSIS — D709 Neutropenia, unspecified: Secondary | ICD-10-CM | POA: Diagnosis not present

## 2020-09-18 DIAGNOSIS — M25512 Pain in left shoulder: Secondary | ICD-10-CM | POA: Diagnosis not present

## 2020-09-18 DIAGNOSIS — Z79899 Other long term (current) drug therapy: Secondary | ICD-10-CM | POA: Diagnosis not present

## 2020-09-19 LAB — NOVEL CORONAVIRUS, NAA: SARS-CoV-2, NAA: NOT DETECTED

## 2020-10-08 DIAGNOSIS — H04123 Dry eye syndrome of bilateral lacrimal glands: Secondary | ICD-10-CM | POA: Diagnosis not present

## 2020-11-29 DIAGNOSIS — M0579 Rheumatoid arthritis with rheumatoid factor of multiple sites without organ or systems involvement: Secondary | ICD-10-CM | POA: Diagnosis not present

## 2020-11-29 DIAGNOSIS — M25562 Pain in left knee: Secondary | ICD-10-CM | POA: Diagnosis not present

## 2020-11-29 DIAGNOSIS — M25561 Pain in right knee: Secondary | ICD-10-CM | POA: Diagnosis not present

## 2020-11-30 ENCOUNTER — Other Ambulatory Visit: Payer: Self-pay | Admitting: Family Medicine

## 2020-12-07 DIAGNOSIS — I10 Essential (primary) hypertension: Secondary | ICD-10-CM | POA: Diagnosis not present

## 2020-12-07 DIAGNOSIS — E785 Hyperlipidemia, unspecified: Secondary | ICD-10-CM | POA: Diagnosis not present

## 2020-12-17 DIAGNOSIS — M0579 Rheumatoid arthritis with rheumatoid factor of multiple sites without organ or systems involvement: Secondary | ICD-10-CM | POA: Diagnosis not present

## 2020-12-17 DIAGNOSIS — Z79899 Other long term (current) drug therapy: Secondary | ICD-10-CM | POA: Diagnosis not present

## 2020-12-19 DIAGNOSIS — M160 Bilateral primary osteoarthritis of hip: Secondary | ICD-10-CM | POA: Diagnosis not present

## 2020-12-19 DIAGNOSIS — M059 Rheumatoid arthritis with rheumatoid factor, unspecified: Secondary | ICD-10-CM | POA: Diagnosis not present

## 2020-12-19 DIAGNOSIS — M545 Low back pain, unspecified: Secondary | ICD-10-CM | POA: Diagnosis not present

## 2020-12-19 DIAGNOSIS — I878 Other specified disorders of veins: Secondary | ICD-10-CM | POA: Diagnosis not present

## 2020-12-19 DIAGNOSIS — Z0000 Encounter for general adult medical examination without abnormal findings: Secondary | ICD-10-CM | POA: Diagnosis not present

## 2020-12-19 DIAGNOSIS — R8290 Unspecified abnormal findings in urine: Secondary | ICD-10-CM | POA: Diagnosis not present

## 2020-12-19 DIAGNOSIS — I10 Essential (primary) hypertension: Secondary | ICD-10-CM | POA: Diagnosis not present

## 2020-12-19 DIAGNOSIS — M25552 Pain in left hip: Secondary | ICD-10-CM | POA: Diagnosis not present

## 2020-12-19 DIAGNOSIS — E785 Hyperlipidemia, unspecified: Secondary | ICD-10-CM | POA: Diagnosis not present

## 2020-12-19 DIAGNOSIS — M47816 Spondylosis without myelopathy or radiculopathy, lumbar region: Secondary | ICD-10-CM | POA: Diagnosis not present

## 2020-12-19 DIAGNOSIS — Z124 Encounter for screening for malignant neoplasm of cervix: Secondary | ICD-10-CM | POA: Diagnosis not present

## 2020-12-19 DIAGNOSIS — M25562 Pain in left knee: Secondary | ICD-10-CM | POA: Diagnosis not present

## 2020-12-19 DIAGNOSIS — M25561 Pain in right knee: Secondary | ICD-10-CM | POA: Diagnosis not present

## 2020-12-19 DIAGNOSIS — M25551 Pain in right hip: Secondary | ICD-10-CM | POA: Diagnosis not present

## 2021-01-08 ENCOUNTER — Other Ambulatory Visit: Payer: Self-pay | Admitting: Family Medicine

## 2021-01-08 DIAGNOSIS — Z1231 Encounter for screening mammogram for malignant neoplasm of breast: Secondary | ICD-10-CM

## 2021-01-10 DIAGNOSIS — M170 Bilateral primary osteoarthritis of knee: Secondary | ICD-10-CM | POA: Diagnosis not present

## 2021-01-10 DIAGNOSIS — M25561 Pain in right knee: Secondary | ICD-10-CM | POA: Diagnosis not present

## 2021-01-10 DIAGNOSIS — M25562 Pain in left knee: Secondary | ICD-10-CM | POA: Diagnosis not present

## 2021-01-11 ENCOUNTER — Other Ambulatory Visit: Payer: Self-pay

## 2021-01-11 ENCOUNTER — Ambulatory Visit
Admission: RE | Admit: 2021-01-11 | Discharge: 2021-01-11 | Disposition: A | Discharge status: Home / Self Care | Payer: PPO | Source: Ambulatory Visit | Attending: Family Medicine | Admitting: Family Medicine

## 2021-01-11 DIAGNOSIS — Z1231 Encounter for screening mammogram for malignant neoplasm of breast: Secondary | ICD-10-CM

## 2021-01-23 DIAGNOSIS — M170 Bilateral primary osteoarthritis of knee: Secondary | ICD-10-CM | POA: Diagnosis not present

## 2021-02-12 ENCOUNTER — Other Ambulatory Visit: Payer: Self-pay | Admitting: Family Medicine

## 2021-02-12 DIAGNOSIS — E041 Nontoxic single thyroid nodule: Secondary | ICD-10-CM

## 2021-03-14 ENCOUNTER — Ambulatory Visit
Admission: RE | Admit: 2021-03-14 | Discharge: 2021-03-14 | Disposition: A | Discharge status: Home / Self Care | Payer: PPO | Source: Ambulatory Visit | Attending: Family Medicine | Admitting: Family Medicine

## 2021-03-14 ENCOUNTER — Other Ambulatory Visit: Payer: Self-pay

## 2021-03-14 DIAGNOSIS — E041 Nontoxic single thyroid nodule: Secondary | ICD-10-CM | POA: Insufficient documentation

## 2021-03-18 DIAGNOSIS — M0579 Rheumatoid arthritis with rheumatoid factor of multiple sites without organ or systems involvement: Secondary | ICD-10-CM | POA: Diagnosis not present

## 2021-03-18 DIAGNOSIS — Z79899 Other long term (current) drug therapy: Secondary | ICD-10-CM | POA: Diagnosis not present

## 2021-03-28 DIAGNOSIS — M159 Polyosteoarthritis, unspecified: Secondary | ICD-10-CM | POA: Diagnosis not present

## 2021-03-28 DIAGNOSIS — M25562 Pain in left knee: Secondary | ICD-10-CM | POA: Diagnosis not present

## 2021-03-28 DIAGNOSIS — Z79899 Other long term (current) drug therapy: Secondary | ICD-10-CM | POA: Diagnosis not present

## 2021-03-28 DIAGNOSIS — G8929 Other chronic pain: Secondary | ICD-10-CM | POA: Diagnosis not present

## 2021-03-28 DIAGNOSIS — M25512 Pain in left shoulder: Secondary | ICD-10-CM | POA: Diagnosis not present

## 2021-03-28 DIAGNOSIS — M0579 Rheumatoid arthritis with rheumatoid factor of multiple sites without organ or systems involvement: Secondary | ICD-10-CM | POA: Diagnosis not present

## 2021-03-28 DIAGNOSIS — M170 Bilateral primary osteoarthritis of knee: Secondary | ICD-10-CM | POA: Diagnosis not present

## 2021-03-28 DIAGNOSIS — M720 Palmar fascial fibromatosis [Dupuytren]: Secondary | ICD-10-CM | POA: Diagnosis not present

## 2021-03-31 DIAGNOSIS — M170 Bilateral primary osteoarthritis of knee: Secondary | ICD-10-CM | POA: Insufficient documentation

## 2021-04-16 ENCOUNTER — Encounter: Payer: Self-pay | Admitting: Dermatology

## 2021-04-21 NOTE — Discharge Instructions (Signed)
Instructions after Total Knee Replacement   Shilpa Bushee P. Laynie Espy, Jr., M.D.     Dept. of Orthopaedics & Sports Medicine  [facility]  [address]  [place], [place]  [address]  Phone: [phone]   Fax: [phone]    DIET: Drink plenty of non-alcoholic fluids. Resume your normal diet. Include foods high in fiber.  ACTIVITY:  You may use crutches or a walker with weight-bearing as tolerated, unless instructed otherwise. You may be weaned off of the walker or crutches by your Physical Therapist.  Do NOT place pillows under the knee. Anything placed under the knee could limit your ability to straighten the knee.   Continue doing gentle exercises. Exercising will reduce the pain and swelling, increase motion, and prevent muscle weakness.   Please continue to use the TED compression stockings for 6 weeks. You may remove the stockings at night, but should reapply them in the morning. Do not drive or operate any equipment until instructed.  WOUND CARE:  Continue to use the PolarCare or ice packs periodically to reduce pain and swelling. You may bathe or shower after the staples are removed at the first office visit following surgery.  MEDICATIONS: You may resume your regular medications. Please take the pain medication as prescribed on the medication. Do not take pain medication on an empty stomach. You have been given a prescription for a blood thinner (Lovenox or Coumadin). Please take the medication as instructed. (NOTE: After completing a 2 week course of Lovenox, take one Enteric-coated aspirin once a day. This along with elevation will help reduce the possibility of phlebitis in your operated leg.) Do not drive or drink alcoholic beverages when taking pain medications.  CALL THE OFFICE FOR: Temperature above 101 degrees Excessive bleeding or drainage on the dressing. Excessive swelling, coldness, or paleness of the toes. Persistent nausea and vomiting.  FOLLOW-UP:  You  should have an appointment to return to the office in 10-14 days after surgery. Arrangements have been made for continuation of Physical Therapy (either home therapy or outpatient therapy).   [facility] Department Directory         [email or web address]       [email or web address]          Cardiology  Appointments: [place] - [phone] [place] - [phone]  Endocrinology  Appointments: [place] - [phone] [place] - [phone]  Gastroenterology  Appointments: [place] - [phone] [place] - [phone]        General Surgery   Appointments: [place] - [phone]  Internal Medicine/Family Medicine  Appointments: [place] - [phone] [place] - [phone] [place] - [phone]  Metabolic and Weigh Loss Surgery  Appointments: [place] - [phone]        Neurology  Appointments: [place] - [phone] [place] - [phone]  Neurosurgery  Appointments: [place] - [phone]  Obstetrics & Gynecology  Appointments: [place] - [phone] [place] - [phone]        Pediatrics  Appointments: [place] - [phone] [place] - [phone]  Physiatry  Appointments: [place] -[phone]  Physical Therapy  Appointments: [place] - [phone] [place] - [phone]        Podiatry  Appointments: [place] - [phone] [place] - [phone]  Pulmonology  Appointments: [place] - [phone]  Rheumatology  Appointments: [place] - [phone]         Location: [facility]  [address] , [place]  [address]  [place] Location: [facility] [address] [place], [place]  [address]  [place] Location: [facility] [address] [place], [place]  [address]    

## 2021-05-14 ENCOUNTER — Encounter
Admission: RE | Admit: 2021-05-14 | Discharge: 2021-05-14 | Disposition: A | Discharge status: Home / Self Care | Payer: PPO | Source: Ambulatory Visit | Attending: Orthopedic Surgery | Admitting: Orthopedic Surgery

## 2021-05-14 ENCOUNTER — Other Ambulatory Visit: Payer: Self-pay

## 2021-05-14 DIAGNOSIS — Z79899 Other long term (current) drug therapy: Secondary | ICD-10-CM | POA: Insufficient documentation

## 2021-05-14 DIAGNOSIS — M1712 Unilateral primary osteoarthritis, left knee: Secondary | ICD-10-CM | POA: Diagnosis not present

## 2021-05-14 DIAGNOSIS — Z01810 Encounter for preprocedural cardiovascular examination: Secondary | ICD-10-CM | POA: Diagnosis not present

## 2021-05-14 DIAGNOSIS — Z01818 Encounter for other preprocedural examination: Secondary | ICD-10-CM | POA: Diagnosis not present

## 2021-05-14 HISTORY — DX: Headache, unspecified: R51.9

## 2021-05-14 HISTORY — DX: Anemia, unspecified: D64.9

## 2021-05-14 LAB — COMPREHENSIVE METABOLIC PANEL
ALT: 14 U/L (ref 0–44)
AST: 25 U/L (ref 15–41)
Albumin: 3.8 g/dL (ref 3.5–5.0)
Alkaline Phosphatase: 75 U/L (ref 38–126)
Anion gap: 7 (ref 5–15)
BUN: 19 mg/dL (ref 8–23)
CO2: 27 mmol/L (ref 22–32)
Calcium: 9.3 mg/dL (ref 8.9–10.3)
Chloride: 99 mmol/L (ref 98–111)
Creatinine, Ser: 0.66 mg/dL (ref 0.44–1.00)
GFR, Estimated: >60 mL/min (ref >60)
Glucose, Bld: 98 mg/dL (ref 70–99)
Potassium: 4.1 mmol/L (ref 3.5–5.1)
Sodium: 133 mmol/L — ABNORMAL LOW (ref 135–145)
Total Bilirubin: 0.9 mg/dL (ref 0.3–1.2)
Total Protein: 6.6 g/dL (ref 6.5–8.1)

## 2021-05-14 LAB — URINALYSIS, ROUTINE W REFLEX MICROSCOPIC
Bilirubin Urine: NEGATIVE
Glucose, UA: NEGATIVE mg/dL
Hgb urine dipstick: NEGATIVE
Ketones, ur: NEGATIVE mg/dL
Leukocytes,Ua: NEGATIVE
Nitrite: NEGATIVE
Protein, ur: NEGATIVE mg/dL
Specific Gravity, Urine: 1.017 (ref 1.005–1.030)
pH: 6 (ref 5.0–8.0)

## 2021-05-14 LAB — CBC
HCT: 39 % (ref 36.0–46.0)
Hemoglobin: 13.3 g/dL (ref 12.0–15.0)
MCH: 29.2 pg (ref 26.0–34.0)
MCHC: 34.1 g/dL (ref 30.0–36.0)
MCV: 85.5 fL (ref 80.0–100.0)
Platelets: 241 10*3/uL (ref 150–400)
RBC: 4.56 MIL/uL (ref 3.87–5.11)
RDW: 14.8 % (ref 11.5–15.5)
WBC: 4.5 10*3/uL (ref 4.0–10.5)
nRBC: 0 % (ref 0.0–0.2)

## 2021-05-14 LAB — TYPE AND SCREEN
ABO/RH(D): B NEG
Antibody Screen: NEGATIVE

## 2021-05-14 LAB — SURGICAL PCR SCREEN
MRSA, PCR: NEGATIVE
Staphylococcus aureus: NEGATIVE

## 2021-05-14 LAB — PROTIME-INR
INR: 1 (ref 0.8–1.2)
Prothrombin Time: 13 seconds (ref 11.4–15.2)

## 2021-05-14 LAB — APTT: aPTT: 29 seconds (ref 24–36)

## 2021-05-14 LAB — C-REACTIVE PROTEIN: CRP: 1.2 mg/dL — ABNORMAL HIGH (ref <1.0)

## 2021-05-14 LAB — SEDIMENTATION RATE: Sed Rate: 7 mm/hr (ref 0–30)

## 2021-05-14 NOTE — Patient Instructions (Addendum)
Your procedure is scheduled on: 05/24/21 - Friday Report to the Registration Desk on the 1st floor of the [facility]. To find out your arrival time, please call [phone] between 1PM - 3PM on: 05/23/21 - Thursday Report to [facility] for Covid Test on 05/22/21 at 8:05 am.  REMEMBER: Instructions that are not followed completely may result in serious medical risk, up to and including death; or upon the discretion of your surgeon and anesthesiologist your surgery may need to be rescheduled.  Do not eat food after midnight the night before surgery.  No gum chewing, lozengers or hard candies.  You may however, drink CLEAR liquids up to 2 hours before you are scheduled to arrive for your surgery. Do not drink anything within 2 hours of your scheduled arrival time.  Clear liquids include: - water  - apple juice without pulp - gatorade (not RED, PURPLE, OR BLUE) - black coffee or tea (Do NOT add milk or creamers to the coffee or tea) Do NOT drink anything that is not on this list.  In addition, your doctor has ordered for you to drink the provided  Ensure Pre-Surgery Clear Carbohydrate Drink  Drinking this carbohydrate drink up to two hours before surgery helps to reduce insulin resistance and improve patient outcomes. Please complete drinking 2 hours prior to scheduled arrival time.  TAKE THESE MEDICATIONS THE MORNING OF SURGERY WITH A SIP OF WATER:  - omeprazole (PRILOSEC) 20 MG capsule, (take one the night before and one on the morning of surgery - helps to prevent nausea after surgery.) - simvastatin (ZOCOR) 20 MG tablet  - Stop taking  Adalimumab (HUMIRA PEN) 40 MG/0.8ML 14 days prior to your procedure. - Stop taking sulindac (CLINORIL) 150 MG tablet 7 days prior to your procedure.  One week prior to surgery: Stop Anti-inflammatories (NSAIDS) such as Advil, Aleve, Ibuprofen, Motrin, Naproxen, Naprosyn and Aspirin based products such as Excedrin, Goodys Powder, BC  Powder.  Stop ANY OVER THE COUNTER supplements until after surgery.  You may take Tylenol as directed if needed for pain up until the day of surgery.  No Alcohol for 24 hours before or after surgery.  No Smoking including e-cigarettes for 24 hours prior to surgery.  No chewable tobacco products for at least 6 hours prior to surgery.  No nicotine patches on the day of surgery.  Do not use any "recreational" drugs for at least a week prior to your surgery.  Please be advised that the combination of cocaine and anesthesia may have negative outcomes, up to and including death. If you test positive for cocaine, your surgery will be cancelled.  On the morning of surgery brush your teeth with toothpaste and water, you may rinse your mouth with mouthwash if you wish. Do not swallow any toothpaste or mouthwash.  Do not wear jewelry, make-up, hairpins, clips or nail polish.  Do not wear lotions, powders, or perfumes.   Do not shave body from the neck down 48 hours prior to surgery just in case you cut yourself which could leave a site for infection.  Also, freshly shaved skin may become irritated if using the CHG soap.  Contact lenses, hearing aids and dentures may not be worn into surgery.  Do not bring valuables to the hospital. [facility] is not responsible for any missing/lost belongings or valuables.   Use CHG Soap or wipes as directed on instruction sheet.  Notify your doctor if there is any change in your medical condition (cold,  fever, infection).  Wear comfortable clothing (specific to your surgery type) to the hospital.  After surgery, you can help prevent lung complications by doing breathing exercises.  Take deep breaths and cough every 1-2 hours. Your doctor may order a device called an Incentive Spirometer to help you take deep breaths. When coughing or sneezing, hold a pillow firmly against your incision with both hands. This is called "splinting." Doing this helps protect  your incision. It also decreases belly discomfort.  If you are being admitted to the hospital overnight, leave your suitcase in the car. After surgery it may be brought to your room.  If you are being discharged the day of surgery, you will not be allowed to drive home. You will need a responsible adult (18 years or older) to drive you home and stay with you that night.   If you are taking public transportation, you will need to have a responsible adult (18 years or older) with you. Please confirm with your physician that it is acceptable to use public transportation.   Please call the [facility] at [phone] if you have any questions about these instructions.  Surgery Visitation Policy:  Patients undergoing a surgery or procedure may have one family member or support person with them as long as that person is not COVID-19 positive or experiencing its symptoms.  That person may remain in the waiting area during the procedure.  Inpatient Visitation:    Visiting hours are 7 a.m. to 8 p.m. Inpatients will be allowed two visitors daily. The visitors may change each day during the patient's stay. No visitors under the age of 60. Any visitor under the age of 72 must be accompanied by an adult. The visitor must pass COVID-19 screenings, use hand sanitizer when entering and exiting the patient's room and wear a mask at all times, including in the patient's room. Patients must also wear a mask when staff or their visitor are in the room. Masking is required regardless of vaccination status.

## 2021-05-16 DIAGNOSIS — M1712 Unilateral primary osteoarthritis, left knee: Secondary | ICD-10-CM | POA: Diagnosis not present

## 2021-05-16 LAB — URINE CULTURE
Culture: <10000 — AB
Special Requests: NORMAL

## 2021-05-17 LAB — IGE: IgE (Immunoglobulin E), Serum: 65 IU/mL (ref 6–495)

## 2021-05-22 ENCOUNTER — Other Ambulatory Visit: Payer: Self-pay

## 2021-05-22 ENCOUNTER — Other Ambulatory Visit
Admission: RE | Admit: 2021-05-22 | Discharge: 2021-05-22 | Disposition: A | Discharge status: Home / Self Care | Payer: PPO | Source: Ambulatory Visit | Attending: Orthopedic Surgery | Admitting: Orthopedic Surgery

## 2021-05-22 DIAGNOSIS — Z20822 Contact with and (suspected) exposure to covid-19: Secondary | ICD-10-CM | POA: Insufficient documentation

## 2021-05-22 DIAGNOSIS — Z01812 Encounter for preprocedural laboratory examination: Secondary | ICD-10-CM | POA: Insufficient documentation

## 2021-05-22 LAB — SARS CORONAVIRUS 2 (TAT 6-24 HRS): SARS Coronavirus 2: NEGATIVE

## 2021-05-24 ENCOUNTER — Observation Stay: Payer: PPO

## 2021-05-24 ENCOUNTER — Other Ambulatory Visit: Payer: Self-pay

## 2021-05-24 ENCOUNTER — Ambulatory Visit: Payer: PPO | Admitting: Urgent Care

## 2021-05-24 ENCOUNTER — Observation Stay
Admission: RE | Admit: 2021-05-24 | Discharge: 2021-05-25 | Disposition: A | Discharge status: Home / Self Care | Payer: PPO | Attending: Orthopedic Surgery | Admitting: Orthopedic Surgery

## 2021-05-24 ENCOUNTER — Encounter
Admission: RE | Disposition: A | Discharge status: Home / Self Care | Payer: Self-pay | Source: Home / Self Care | Attending: Orthopedic Surgery

## 2021-05-24 ENCOUNTER — Ambulatory Visit: Payer: PPO | Admitting: Anesthesiology

## 2021-05-24 ENCOUNTER — Encounter: Payer: Self-pay | Admitting: Orthopedic Surgery

## 2021-05-24 DIAGNOSIS — I10 Essential (primary) hypertension: Secondary | ICD-10-CM | POA: Diagnosis not present

## 2021-05-24 DIAGNOSIS — K219 Gastro-esophageal reflux disease without esophagitis: Secondary | ICD-10-CM | POA: Insufficient documentation

## 2021-05-24 DIAGNOSIS — Z79899 Other long term (current) drug therapy: Secondary | ICD-10-CM | POA: Insufficient documentation

## 2021-05-24 DIAGNOSIS — M1712 Unilateral primary osteoarthritis, left knee: Principal | ICD-10-CM | POA: Insufficient documentation

## 2021-05-24 DIAGNOSIS — M654 Radial styloid tenosynovitis [de Quervain]: Secondary | ICD-10-CM | POA: Insufficient documentation

## 2021-05-24 DIAGNOSIS — Z96652 Presence of left artificial knee joint: Secondary | ICD-10-CM | POA: Diagnosis not present

## 2021-05-24 DIAGNOSIS — E785 Hyperlipidemia, unspecified: Secondary | ICD-10-CM | POA: Insufficient documentation

## 2021-05-24 DIAGNOSIS — M720 Palmar fascial fibromatosis [Dupuytren]: Secondary | ICD-10-CM | POA: Insufficient documentation

## 2021-05-24 DIAGNOSIS — Z96659 Presence of unspecified artificial knee joint: Secondary | ICD-10-CM

## 2021-05-24 HISTORY — PX: KNEE ARTHROPLASTY: SHX992

## 2021-05-24 LAB — ABO/RH: ABO/RH(D): B NEG

## 2021-05-24 SURGERY — ARTHROPLASTY, KNEE, TOTAL, USING IMAGELESS COMPUTER-ASSISTED NAVIGATION
Anesthesia: Spinal | Site: Knee | Laterality: Left

## 2021-05-24 MED ORDER — OXYCODONE HCL 5 MG/5ML PO SOLN: 5.0000 mg | Freq: Once | ORAL | Status: DC | PRN

## 2021-05-24 MED ORDER — METOCLOPRAMIDE HCL 10 MG PO TABS
10.0000 mg | ORAL_TABLET | Freq: Three times a day (TID) | ORAL | Status: DC
  Administered 2021-05-24 – 2021-05-25 (×3): 10 mg via ORAL
  Filled 2021-05-24 (×3): qty 1

## 2021-05-24 MED ORDER — FERROUS SULFATE 325 (65 FE) MG PO TABS
325.0000 mg | ORAL_TABLET | Freq: Two times a day (BID) | ORAL | Status: DC
  Administered 2021-05-24 – 2021-05-25 (×2): 325 mg via ORAL
  Filled 2021-05-24 (×2): qty 1

## 2021-05-24 MED ORDER — PHENOL 1.4 % MT LIQD
1.0000 | OROMUCOSAL | Status: DC | PRN
  Filled 2021-05-24: qty 177

## 2021-05-24 MED ORDER — VITAMIN B-12 1000 MCG PO TABS
3000.0000 ug | ORAL_TABLET | Freq: Every day | ORAL | Status: DC
  Administered 2021-05-24 – 2021-05-25 (×2): 3000 ug via ORAL
  Filled 2021-05-24 (×2): qty 3

## 2021-05-24 MED ORDER — CHLORHEXIDINE GLUCONATE 4 % EX LIQD: 60.0000 mL | Freq: Once | CUTANEOUS | Status: DC

## 2021-05-24 MED ORDER — PANTOPRAZOLE SODIUM 40 MG PO TBEC
40.0000 mg | DELAYED_RELEASE_TABLET | Freq: Two times a day (BID) | ORAL | Status: DC
  Administered 2021-05-24 – 2021-05-25 (×3): 40 mg via ORAL
  Filled 2021-05-24 (×3): qty 1

## 2021-05-24 MED ORDER — LISINOPRIL 10 MG PO TABS
10.0000 mg | ORAL_TABLET | Freq: Every day | ORAL | Status: DC
  Administered 2021-05-24 – 2021-05-25 (×2): 10 mg via ORAL
  Filled 2021-05-24 (×2): qty 1

## 2021-05-24 MED ORDER — SIMVASTATIN 20 MG PO TABS
20.0000 mg | ORAL_TABLET | Freq: Every day | ORAL | Status: DC
  Administered 2021-05-24 – 2021-05-25 (×2): 20 mg via ORAL
  Filled 2021-05-24 (×2): qty 1

## 2021-05-24 MED ORDER — ACETAMINOPHEN 10 MG/ML IV SOLN
INTRAVENOUS | Status: DC | PRN
  Administered 2021-05-24: 1000 mg via INTRAVENOUS

## 2021-05-24 MED ORDER — CEFAZOLIN SODIUM-DEXTROSE 2-4 GM/100ML-% IV SOLN
INTRAVENOUS | Status: AC
  Filled 2021-05-24: qty 100

## 2021-05-24 MED ORDER — ACETAMINOPHEN 10 MG/ML IV SOLN
INTRAVENOUS | Status: AC
  Filled 2021-05-24: qty 100

## 2021-05-24 MED ORDER — DIPHENHYDRAMINE HCL 12.5 MG/5ML PO ELIX: 12.5000 mg | ORAL_SOLUTION | ORAL | Status: DC | PRN

## 2021-05-24 MED ORDER — PROPOFOL 500 MG/50ML IV EMUL
INTRAVENOUS | Status: DC | PRN
  Administered 2021-05-24: 100 ug/kg/min via INTRAVENOUS

## 2021-05-24 MED ORDER — DEXAMETHASONE SODIUM PHOSPHATE 10 MG/ML IJ SOLN
INTRAMUSCULAR | Status: DC | PRN
  Administered 2021-05-24: 10 mg via INTRAVENOUS

## 2021-05-24 MED ORDER — SODIUM CHLORIDE (PF) 0.9 % IJ SOLN
INTRAMUSCULAR | Status: DC | PRN
  Administered 2021-05-24: 120 mL via INTRAMUSCULAR

## 2021-05-24 MED ORDER — MENTHOL 3 MG MT LOZG
1.0000 | LOZENGE | OROMUCOSAL | Status: DC | PRN
  Filled 2021-05-24: qty 9

## 2021-05-24 MED ORDER — HYDROCHLOROTHIAZIDE 12.5 MG PO CAPS
12.5000 mg | ORAL_CAPSULE | Freq: Every day | ORAL | Status: DC
  Administered 2021-05-24 – 2021-05-25 (×2): 12.5 mg via ORAL
  Filled 2021-05-24 (×2): qty 1

## 2021-05-24 MED ORDER — KRILL OIL 350 MG PO CAPS: 350.0000 mg | ORAL_CAPSULE | Freq: Every day | ORAL | Status: DC

## 2021-05-24 MED ORDER — CELECOXIB 200 MG PO CAPS
ORAL_CAPSULE | ORAL | Status: AC
  Administered 2021-05-24: 400 mg via ORAL
  Filled 2021-05-24: qty 2

## 2021-05-24 MED ORDER — MIDAZOLAM HCL 5 MG/5ML IJ SOLN
INTRAMUSCULAR | Status: DC | PRN
  Administered 2021-05-24: .5 mg via INTRAVENOUS
  Administered 2021-05-24: 1 mg via INTRAVENOUS
  Administered 2021-05-24: .5 mg via INTRAVENOUS

## 2021-05-24 MED ORDER — GABAPENTIN 300 MG PO CAPS
ORAL_CAPSULE | ORAL | Status: AC
  Administered 2021-05-24: 300 mg via ORAL
  Filled 2021-05-24: qty 1

## 2021-05-24 MED ORDER — ENOXAPARIN SODIUM 30 MG/0.3ML IJ SOSY
30.0000 mg | PREFILLED_SYRINGE | Freq: Two times a day (BID) | INTRAMUSCULAR | Status: DC
  Administered 2021-05-25: 30 mg via SUBCUTANEOUS
  Filled 2021-05-24: qty 0.3

## 2021-05-24 MED ORDER — CHLORHEXIDINE GLUCONATE 0.12 % MT SOLN: 15.0000 mL | Freq: Once | OROMUCOSAL | Status: AC

## 2021-05-24 MED ORDER — ALUM & MAG HYDROXIDE-SIMETH 200-200-20 MG/5ML PO SUSP: 30.0000 mL | ORAL | Status: DC | PRN

## 2021-05-24 MED ORDER — ONDANSETRON HCL 4 MG/2ML IJ SOLN
INTRAMUSCULAR | Status: DC | PRN
  Administered 2021-05-24: 4 mg via INTRAVENOUS

## 2021-05-24 MED ORDER — SODIUM CHLORIDE FLUSH 0.9 % IV SOLN
INTRAVENOUS | Status: AC
  Filled 2021-05-24: qty 80

## 2021-05-24 MED ORDER — MAGNESIUM HYDROXIDE 400 MG/5ML PO SUSP
30.0000 mL | Freq: Every day | ORAL | Status: DC
  Administered 2021-05-24 – 2021-05-25 (×2): 30 mL via ORAL
  Filled 2021-05-24 (×2): qty 30

## 2021-05-24 MED ORDER — ACETAMINOPHEN 325 MG PO TABS: 325.0000 mg | ORAL_TABLET | Freq: Four times a day (QID) | ORAL | Status: DC | PRN

## 2021-05-24 MED ORDER — DEXAMETHASONE SODIUM PHOSPHATE 10 MG/ML IJ SOLN: 8.0000 mg | Freq: Once | INTRAMUSCULAR | Status: AC

## 2021-05-24 MED ORDER — CEFAZOLIN SODIUM-DEXTROSE 2-4 GM/100ML-% IV SOLN
2.0000 g | Freq: Four times a day (QID) | INTRAVENOUS | Status: AC
Start: 2021-05-24 — End: 2021-05-24
  Administered 2021-05-24 (×2): 2 g via INTRAVENOUS
  Filled 2021-05-24 (×2): qty 100

## 2021-05-24 MED ORDER — DEXAMETHASONE SODIUM PHOSPHATE 10 MG/ML IJ SOLN
INTRAMUSCULAR | Status: AC
  Administered 2021-05-24: 8 mg via INTRAVENOUS
  Filled 2021-05-24: qty 1

## 2021-05-24 MED ORDER — FENTANYL CITRATE (PF) 100 MCG/2ML IJ SOLN: 25.0000 ug | INTRAMUSCULAR | Status: DC | PRN

## 2021-05-24 MED ORDER — HYDROMORPHONE HCL 1 MG/ML IJ SOLN: 0.5000 mg | INTRAMUSCULAR | Status: DC | PRN

## 2021-05-24 MED ORDER — TRAMADOL HCL 50 MG PO TABS
50.0000 mg | ORAL_TABLET | ORAL | Status: DC | PRN
  Administered 2021-05-24: 50 mg via ORAL
  Administered 2021-05-24: 100 mg via ORAL
  Filled 2021-05-24 (×2): qty 1
  Filled 2021-05-24: qty 2

## 2021-05-24 MED ORDER — FOLIC ACID 1 MG PO TABS
1500.0000 ug | ORAL_TABLET | Freq: Every day | ORAL | Status: DC
  Administered 2021-05-24 – 2021-05-25 (×2): 1.5 mg via ORAL
  Filled 2021-05-24 (×2): qty 2

## 2021-05-24 MED ORDER — VITAMIN D 25 MCG (1000 UNIT) PO TABS
1000.0000 [IU] | ORAL_TABLET | Freq: Every day | ORAL | Status: DC
  Administered 2021-05-24 – 2021-05-25 (×2): 1000 [IU] via ORAL
  Filled 2021-05-24 (×2): qty 1

## 2021-05-24 MED ORDER — GABAPENTIN 300 MG PO CAPS: 300.0000 mg | ORAL_CAPSULE | Freq: Once | ORAL | Status: AC

## 2021-05-24 MED ORDER — PHENYLEPHRINE HCL (PRESSORS) 10 MG/ML IV SOLN
INTRAVENOUS | Status: AC
  Filled 2021-05-24: qty 1

## 2021-05-24 MED ORDER — ENSURE PRE-SURGERY PO LIQD
296.0000 mL | Freq: Once | ORAL | Status: DC
  Filled 2021-05-24: qty 296

## 2021-05-24 MED ORDER — FENTANYL CITRATE (PF) 100 MCG/2ML IJ SOLN
INTRAMUSCULAR | Status: DC | PRN
  Administered 2021-05-24 (×4): 25 ug via INTRAVENOUS

## 2021-05-24 MED ORDER — ONDANSETRON HCL 4 MG/2ML IJ SOLN: 4.0000 mg | Freq: Four times a day (QID) | INTRAMUSCULAR | Status: DC | PRN

## 2021-05-24 MED ORDER — OXYCODONE HCL 5 MG PO TABS: 5.0000 mg | ORAL_TABLET | ORAL | Status: DC | PRN

## 2021-05-24 MED ORDER — SALINE SPRAY 0.65 % NA SOLN
1.0000 | Freq: Every day | NASAL | Status: DC
  Filled 2021-05-24: qty 44

## 2021-05-24 MED ORDER — FLEET ENEMA 7-19 GM/118ML RE ENEM: 1.0000 | ENEMA | Freq: Once | RECTAL | Status: DC | PRN

## 2021-05-24 MED ORDER — ORAL CARE MOUTH RINSE: 15.0000 mL | Freq: Once | OROMUCOSAL | Status: AC

## 2021-05-24 MED ORDER — SODIUM CHLORIDE 0.9 % IV SOLN
INTRAVENOUS | Status: DC | PRN
  Administered 2021-05-24: 30 ug/min via INTRAVENOUS

## 2021-05-24 MED ORDER — BUPIVACAINE HCL (PF) 0.5 % IJ SOLN
INTRAMUSCULAR | Status: DC | PRN
  Administered 2021-05-24: 3 mL

## 2021-05-24 MED ORDER — CELECOXIB 200 MG PO CAPS
200.0000 mg | ORAL_CAPSULE | Freq: Two times a day (BID) | ORAL | Status: DC
  Administered 2021-05-24 – 2021-05-25 (×2): 200 mg via ORAL
  Filled 2021-05-24 (×3): qty 1

## 2021-05-24 MED ORDER — CHLORHEXIDINE GLUCONATE 0.12 % MT SOLN
OROMUCOSAL | Status: AC
  Administered 2021-05-24: 15 mL via OROMUCOSAL
  Filled 2021-05-24: qty 15

## 2021-05-24 MED ORDER — BISACODYL 10 MG RE SUPP: 10.0000 mg | Freq: Every day | RECTAL | Status: DC | PRN

## 2021-05-24 MED ORDER — BUPIVACAINE LIPOSOME 1.3 % IJ SUSP
INTRAMUSCULAR | Status: AC
  Filled 2021-05-24: qty 40

## 2021-05-24 MED ORDER — BUPIVACAINE HCL (PF) 0.25 % IJ SOLN
INTRAMUSCULAR | Status: AC
  Filled 2021-05-24: qty 120

## 2021-05-24 MED ORDER — OXYCODONE HCL 5 MG PO TABS: 10.0000 mg | ORAL_TABLET | ORAL | Status: DC | PRN

## 2021-05-24 MED ORDER — METHOTREXATE 2.5 MG PO TABS: 20.0000 mg | ORAL_TABLET | ORAL | Status: DC

## 2021-05-24 MED ORDER — FENTANYL CITRATE (PF) 100 MCG/2ML IJ SOLN
INTRAMUSCULAR | Status: AC
  Filled 2021-05-24: qty 2

## 2021-05-24 MED ORDER — ADULT MULTIVITAMIN W/MINERALS CH
1.0000 | ORAL_TABLET | Freq: Every day | ORAL | Status: DC
  Administered 2021-05-24 – 2021-05-25 (×2): 1 via ORAL
  Filled 2021-05-24 (×2): qty 1

## 2021-05-24 MED ORDER — ONDANSETRON HCL 4 MG PO TABS: 4.0000 mg | ORAL_TABLET | Freq: Four times a day (QID) | ORAL | Status: DC | PRN

## 2021-05-24 MED ORDER — POLYVINYL ALCOHOL 1.4 % OP SOLN
1.0000 [drp] | Freq: Every day | OPHTHALMIC | Status: DC | PRN
  Filled 2021-05-24: qty 15

## 2021-05-24 MED ORDER — LISINOPRIL-HYDROCHLOROTHIAZIDE 10-12.5 MG PO TABS: 1.0000 | ORAL_TABLET | Freq: Every day | ORAL | Status: DC

## 2021-05-24 MED ORDER — RISAQUAD PO CAPS
1.0000 | ORAL_CAPSULE | Freq: Every day | ORAL | Status: DC
  Administered 2021-05-24 – 2021-05-25 (×2): 1 via ORAL
  Filled 2021-05-24 (×2): qty 1

## 2021-05-24 MED ORDER — TRANEXAMIC ACID-NACL 1000-0.7 MG/100ML-% IV SOLN: 1000.0000 mg | Freq: Once | INTRAVENOUS | Status: AC

## 2021-05-24 MED ORDER — TRANEXAMIC ACID-NACL 1000-0.7 MG/100ML-% IV SOLN
INTRAVENOUS | Status: AC
  Filled 2021-05-24: qty 100

## 2021-05-24 MED ORDER — CELECOXIB 200 MG PO CAPS: 400.0000 mg | ORAL_CAPSULE | Freq: Once | ORAL | Status: AC

## 2021-05-24 MED ORDER — LACTATED RINGERS IV SOLN: INTRAVENOUS | Status: DC

## 2021-05-24 MED ORDER — TRANEXAMIC ACID-NACL 1000-0.7 MG/100ML-% IV SOLN
INTRAVENOUS | Status: AC
  Administered 2021-05-24: 1000 mg via INTRAVENOUS
  Filled 2021-05-24: qty 100

## 2021-05-24 MED ORDER — SODIUM CHLORIDE 0.9 % IR SOLN
Status: DC | PRN
  Administered 2021-05-24: 3000 mL

## 2021-05-24 MED ORDER — OXYCODONE HCL 5 MG PO TABS: 5.0000 mg | ORAL_TABLET | Freq: Once | ORAL | Status: DC | PRN

## 2021-05-24 MED ORDER — SODIUM CHLORIDE 0.9 % IV SOLN: INTRAVENOUS | Status: DC

## 2021-05-24 MED ORDER — TRANEXAMIC ACID-NACL 1000-0.7 MG/100ML-% IV SOLN
1000.0000 mg | INTRAVENOUS | Status: AC
  Administered 2021-05-24: 1000 mg via INTRAVENOUS

## 2021-05-24 MED ORDER — MIDAZOLAM HCL 2 MG/2ML IJ SOLN
INTRAMUSCULAR | Status: AC
  Filled 2021-05-24: qty 2

## 2021-05-24 MED ORDER — SENNOSIDES-DOCUSATE SODIUM 8.6-50 MG PO TABS
1.0000 | ORAL_TABLET | Freq: Two times a day (BID) | ORAL | Status: DC
  Administered 2021-05-24 – 2021-05-25 (×3): 1 via ORAL
  Filled 2021-05-24 (×3): qty 1

## 2021-05-24 MED ORDER — ACETAMINOPHEN 10 MG/ML IV SOLN
1000.0000 mg | Freq: Four times a day (QID) | INTRAVENOUS | Status: AC
Start: 2021-05-24 — End: 2021-05-25
  Administered 2021-05-24 – 2021-05-25 (×4): 1000 mg via INTRAVENOUS
  Filled 2021-05-24 (×4): qty 100

## 2021-05-24 MED ORDER — CEFAZOLIN SODIUM-DEXTROSE 2-4 GM/100ML-% IV SOLN
2.0000 g | INTRAVENOUS | Status: AC
  Administered 2021-05-24: 2 g via INTRAVENOUS

## 2021-05-24 MED ORDER — CLINDAMYCIN PHOS-BENZOYL PEROX 1-5 % EX GEL
1.0000 "application " | Freq: Every day | CUTANEOUS | Status: DC | PRN
  Filled 2021-05-24: qty 50

## 2021-05-24 MED ORDER — 0.9 % SODIUM CHLORIDE (POUR BTL) OPTIME
TOPICAL | Status: DC | PRN
  Administered 2021-05-24: 500 mL

## 2021-05-24 MED ORDER — PROPOFOL 1000 MG/100ML IV EMUL
INTRAVENOUS | Status: AC
  Filled 2021-05-24: qty 100

## 2021-05-24 MED ORDER — SURGIPHOR WOUND IRRIGATION SYSTEM - OPTIME
TOPICAL | Status: DC | PRN
  Administered 2021-05-24: 1

## 2021-05-24 SURGICAL SUPPLY — 73 items
ATTUNE PSFEM LTSZ4 NARCEM KNEE (Femur) ×2 IMPLANT
ATTUNE PSRP INSR SZ4 7 KNEE (Insert) ×2 IMPLANT
BASE TIBIAL ROT PLAT SZ 3 KNEE (Knees) ×1 IMPLANT
BATTERY INSTRU NAVIGATION (MISCELLANEOUS) ×8 IMPLANT
BLADE SAW 70X12.5 (BLADE) ×2 IMPLANT
BLADE SAW 90X13X1.19 OSCILLAT (BLADE) ×2 IMPLANT
BLADE SAW 90X25X1.19 OSCILLAT (BLADE) ×2 IMPLANT
BONE CEMENT GENTAMICIN (Cement) ×4 IMPLANT
CEMENT BONE GENTAMICIN 40 (Cement) ×2 IMPLANT
COOLER POLAR GLACIER W/PUMP (MISCELLANEOUS) ×2 IMPLANT
CUFF TOURN SGL QUICK 24 (TOURNIQUET CUFF)
CUFF TOURN SGL QUICK 34 (TOURNIQUET CUFF)
CUFF TRNQT CYL 24X4X16.5-23 (TOURNIQUET CUFF) IMPLANT
CUFF TRNQT CYL 34X4.125X (TOURNIQUET CUFF) IMPLANT
DRAPE 3/4 80X56 (DRAPES) ×2 IMPLANT
DRAPE INCISE IOBAN 66X45 STRL (DRAPES) ×2 IMPLANT
DRSG DERMACEA 8X12 NADH (GAUZE/BANDAGES/DRESSINGS) ×2 IMPLANT
DRSG MEPILEX SACRM 8.7X9.8 (GAUZE/BANDAGES/DRESSINGS) ×2 IMPLANT
DRSG OPSITE POSTOP 4X14 (GAUZE/BANDAGES/DRESSINGS) ×2 IMPLANT
DRSG TEGADERM 4X4.75 (GAUZE/BANDAGES/DRESSINGS) ×2 IMPLANT
DURAPREP 26ML APPLICATOR (WOUND CARE) ×4 IMPLANT
ELECT CAUTERY BLADE 6.4 (BLADE) ×2 IMPLANT
ELECT REM PT RETURN 9FT ADLT (ELECTROSURGICAL) ×2
ELECTRODE REM PT RTRN 9FT ADLT (ELECTROSURGICAL) ×1 IMPLANT
EX-PIN ORTHOLOCK NAV 4X150 (PIN) ×4 IMPLANT
GAUZE 4X4 16PLY ~~LOC~~+RFID DBL (SPONGE) IMPLANT
GLOVE SURG ENC TEXT LTX SZ7.5 (GLOVE) ×4 IMPLANT
GLOVE SURG UNDER POLY LF SZ7.5 (GLOVE) ×4 IMPLANT
GOWN STRL REUS W/ TWL LRG LVL3 (GOWN DISPOSABLE) ×2 IMPLANT
GOWN STRL REUS W/ TWL XL LVL3 (GOWN DISPOSABLE) ×1 IMPLANT
GOWN STRL REUS W/TWL LRG LVL3 (GOWN DISPOSABLE) ×2
GOWN STRL REUS W/TWL XL LVL3 (GOWN DISPOSABLE) ×1
HEMOVAC 400CC 10FR (MISCELLANEOUS) ×2 IMPLANT
HOLDER FOLEY CATH W/STRAP (MISCELLANEOUS) ×2 IMPLANT
IRRIGATION SURGIPHOR STRL (IV SOLUTION) ×2 IMPLANT
IV NS IRRIG 3000ML ARTHROMATIC (IV SOLUTION) ×2 IMPLANT
KIT TURNOVER KIT A (KITS) ×2 IMPLANT
KNIFE SCULPS 14X20 (INSTRUMENTS) ×2 IMPLANT
LABEL OR SOLS (LABEL) ×2 IMPLANT
MANIFOLD NEPTUNE II (INSTRUMENTS) ×4 IMPLANT
NDL SAFETY ECLIPSE 18X1.5 (NEEDLE) ×1 IMPLANT
NEEDLE HYPO 18GX1.5 SHARP (NEEDLE) ×1
NEEDLE SPNL 20GX3.5 QUINCKE YW (NEEDLE) ×4 IMPLANT
NS IRRIG 500ML POUR BTL (IV SOLUTION) ×2 IMPLANT
PACK TOTAL KNEE (MISCELLANEOUS) ×2 IMPLANT
PAD ABD DERMACEA PRESS 5X9 (GAUZE/BANDAGES/DRESSINGS) ×4 IMPLANT
PAD WRAPON POLAR KNEE (MISCELLANEOUS) ×1 IMPLANT
PATELLA MEDIAL ATTUN 35MM KNEE (Knees) ×2 IMPLANT
PENCIL SMOKE EVACUATOR COATED (MISCELLANEOUS) ×2 IMPLANT
PIN DRILL FIX HALF THREAD (BIT) ×4 IMPLANT
PIN FIXATION 1/8DIA X 3INL (PIN) ×2 IMPLANT
PULSAVAC PLUS IRRIG FAN TIP (DISPOSABLE) ×2
SOL PREP PVP 2OZ (MISCELLANEOUS) ×2
SOLUTION PREP PVP 2OZ (MISCELLANEOUS) ×1 IMPLANT
SPONGE DRAIN TRACH 4X4 STRL 2S (GAUZE/BANDAGES/DRESSINGS) ×2 IMPLANT
SPONGE T-LAP 18X18 ~~LOC~~+RFID (SPONGE) ×6 IMPLANT
STAPLER SKIN PROX 35W (STAPLE) ×2 IMPLANT
STOCKINETTE IMPERV 14X48 (MISCELLANEOUS) IMPLANT
STRAP TIBIA SHORT (MISCELLANEOUS) ×2 IMPLANT
SUCTION FRAZIER HANDLE 10FR (MISCELLANEOUS) ×1
SUCTION TUBE FRAZIER 10FR DISP (MISCELLANEOUS) ×1 IMPLANT
SUT VIC AB 0 CT1 36 (SUTURE) ×4 IMPLANT
SUT VIC AB 1 CT1 36 (SUTURE) ×4 IMPLANT
SUT VIC AB 2-0 CT2 27 (SUTURE) ×2 IMPLANT
SYR 20ML LL LF (SYRINGE) ×2 IMPLANT
SYR 30ML LL (SYRINGE) ×4 IMPLANT
TIBIAL BASE ROT PLAT SZ 3 KNEE (Knees) ×2 IMPLANT
TIP FAN IRRIG PULSAVAC PLUS (DISPOSABLE) ×1 IMPLANT
TOWEL OR 17X26 4PK STRL BLUE (TOWEL DISPOSABLE) ×2 IMPLANT
TOWER CARTRIDGE SMART MIX (DISPOSABLE) ×2 IMPLANT
TRAY FOLEY MTR SLVR 16FR STAT (SET/KITS/TRAYS/PACK) ×2 IMPLANT
WATER STERILE IRR 500ML POUR (IV SOLUTION) ×2 IMPLANT
WRAPON POLAR PAD KNEE (MISCELLANEOUS) ×2

## 2021-05-24 NOTE — Progress Notes (Signed)
PHARMACIST - PHYSICIAN ORDER COMMUNICATION  CONCERNING: P&T Medication Policy on Herbal Medications  DESCRIPTION:  This patient's order for:  Krill Oil CAPS 350 mg  has been noted.  This product(s) is classified as an "herbal" or natural product. Due to a lack of definitive safety studies or FDA approval, nonstandard manufacturing practices, plus the potential risk of unknown drug-drug interactions while on inpatient medications, the Pharmacy and Therapeutics Committee does not permit the use of "herbal" or natural products of this type within [facility].   ACTION TAKEN: The pharmacy department is unable to verify this order at this time.  Please reevaluate patient's clinical condition at discharge and address if the herbal or natural product(s) should be resumed at that time.  Pernell Dupre, PharmD, BCPS Clinical Pharmacist 05/24/2021 12:33 PM

## 2021-05-24 NOTE — Plan of Care (Signed)

## 2021-05-24 NOTE — Evaluation (Signed)
Physical Therapy Evaluation Patient Details Name: Deborah Singh MRN: [id number] DOB: [birth year] Today's Date: 05/24/2021  History of Present Illness  Pt is a 70 yo female s/p L TKA, WBAT. PMH of HTN, GERD.  Clinical Impression  Pt A&Ox4, reported minimal pain (4-5/10 at end of session, RN in room). Pt reported that she is independent at baseline, pt will have her husband to assist at discharge.  The patient was able to perform several supine exercises with verbal and tactile cueing. Supine to sit with supervision, good sitting balance. Sit <> stand with RW and CGA, able to march in place and transfer to the chair. True ambulation deferred due to continued sensation deficits.  Overall the patient demonstrated deficits (see "PT Problem List") that impede the patient's functional abilities, safety, and mobility and would benefit from skilled PT intervention. Recommendation is HHPT with intermittent supervision.      Recommendations for follow up therapy are one component of a multi-disciplinary discharge planning process, led by the attending physician.  Recommendations may be updated based on patient status, additional functional criteria and insurance authorization.  Follow Up Recommendations Home health PT;Supervision - Intermittent    Equipment Recommendations  Other (comment) (tub bench)    Recommendations for Other Services       Precautions / Restrictions Precautions Precautions: Fall;Knee Precaution Booklet Issued: Yes (comment) Restrictions Weight Bearing Restrictions: Yes LLE Weight Bearing: Weight bearing as tolerated      Mobility  Bed Mobility Overal bed mobility: Needs Assistance Bed Mobility: Supine to Sit     Supine to sit: Supervision;HOB elevated          Transfers Overall transfer level: Needs assistance Equipment used: Rolling walker (2 wheeled) Transfers: Sit to/from Stand Sit to Stand: Min guard         General transfer comment: cued for  hand placement  Ambulation/Gait Ambulation/Gait assistance: Min guard Gait Distance (Feet): 3 Feet Assistive device: Rolling walker (2 wheeled)       General Gait Details: pt able to march in place; some sensation deficits still noted; true ambulation held  [employer] Mobility    Modified Rankin (Stroke Patients Only)       Balance Overall balance assessment: Needs assistance Sitting-balance support: Feet supported Sitting balance-Leahy Scale: Normal       Standing balance-Leahy Scale: Fair Standing balance comment: reliant on UE support for mobility                             Pertinent Vitals/Pain Pain Assessment: 0-10 Pain Score: 4  Pain Location: initially denied pain, some pain reported after transfer to recliner Pain Descriptors / Indicators: Sore Pain Intervention(s): Limited activity within patient's tolerance;Monitored during session;Repositioned;Ice applied;RN gave pain meds during session    [place] expects to be discharged to:: Private residence Living Arrangements: Spouse/significant other Available Help at Discharge: Family;Available 24 hours/day Type of Home: House Home Access: Stairs to enter Entrance Stairs-Rails: Right Entrance Stairs-Number of Steps: 3 Home Layout: Two level;Able to live on main level with bedroom/bathroom Home Equipment: Gilford Rile - 2 wheels      Prior Function Level of Independence: Independent         Comments: no falls     Hand Dominance        Extremity/Trunk Assessment   Upper Extremity Assessment Upper Extremity Assessment: Overall WFL for tasks assessed  Lower Extremity Assessment Lower Extremity Assessment:  (s/p LTKA, RLE WFLs)    Cervical / Trunk Assessment Cervical / Trunk Assessment: Normal  Communication   Communication: No difficulties  Cognition Arousal/Alertness: Awake/alert Behavior During Therapy: WFL for tasks  assessed/performed Overall Cognitive Status: Within Functional Limits for tasks assessed                                        General Comments      Exercises Total Joint Exercises Ankle Circles/Pumps: AROM;Both;10 reps Quad Sets: AROM;Left;10 reps Heel Slides: AROM;Left;10 reps Goniometric ROM: 0 - 65 degrees   Assessment/Plan    PT Assessment Patient needs continued PT services  PT Problem List Decreased strength;Decreased mobility;Decreased range of motion;Decreased activity tolerance;Decreased balance;Pain;Decreased knowledge of use of DME;Decreased knowledge of precautions       PT Treatment Interventions DME instruction;Therapeutic exercise;Gait training;Balance training;Stair training;Neuromuscular re-education;Functional mobility training;Therapeutic activities;Patient/family education    PT Goals (Current goals can be found in the Care Plan section)  Acute Rehab PT Goals Patient Stated Goal: to go home PT Goal Formulation: With patient Time For Goal Achievement: 06/07/21 Potential to Achieve Goals: Good    Frequency BID   Barriers to discharge        Co-evaluation               AM-PAC PT "6 Clicks" Mobility  Outcome Measure Help needed turning from your back to your side while in a flat bed without using bedrails?: None Help needed moving from lying on your back to sitting on the side of a flat bed without using bedrails?: None Help needed moving to and from a bed to a chair (including a wheelchair)?: None Help needed standing up from a chair using your arms (e.g., wheelchair or bedside chair)?: None Help needed to walk in hospital room?: None Help needed climbing 3-5 steps with a railing? : A Little 6 Click Score: 23    End of Session Equipment Utilized During Treatment: Gait belt Activity Tolerance: Patient tolerated treatment well Patient left: in chair;with chair alarm set;with call bell/phone within reach;with nursing/sitter in  room Nurse Communication: Mobility status PT Visit Diagnosis: Other abnormalities of gait and mobility (R26.89);Difficulty in walking, not elsewhere classified (R26.2);Muscle weakness (generalized) (M62.81);Pain Pain - Right/Left: Left Pain - part of body: Knee    Time: 5956-3875 PT Time Calculation (min) (ACUTE ONLY): 32 min   Charges:   PT Evaluation $PT Eval Low Complexity: 1 Low PT Treatments $Therapeutic Exercise: 23-37 mins       Lieutenant Diego PT, DPT 3:28 PM,05/24/21

## 2021-05-24 NOTE — Anesthesia Procedure Notes (Signed)
Spinal  Patient location during procedure: OR Start time: 05/24/2021 7:25 AM End time: 05/24/2021 7:35 AM Reason for block: surgical anesthesia Preanesthetic Checklist Completed: patient identified, IV checked, site marked, risks and benefits discussed, surgical consent, monitors and equipment checked, pre-op evaluation and timeout performed Spinal Block Patient position: sitting Prep: DuraPrep Patient monitoring: heart rate, cardiac monitor, continuous pulse ox and blood pressure Approach: midline Location: L3-4 Injection technique: single-shot Needle Needle type: Sprotte  Needle gauge: 24 G Needle length: 9 cm Assessment Sensory level: T12 Events: CSF return

## 2021-05-24 NOTE — Transfer of Care (Signed)
Immediate Anesthesia Transfer of Care Note  Patient: Deborah Singh  Procedure(s) Performed: COMPUTER ASSISTED TOTAL KNEE ARTHROPLASTY - RNFA (Left: Knee)  Patient Location: PACU  Anesthesia Type:Spinal  Level of Consciousness: awake, alert  and oriented  Airway & Oxygen Therapy: Patient Spontanous Breathing and Patient connected to face mask oxygen  Post-op Assessment: Report given to RN and Post -op Vital signs reviewed and stable  Post vital signs: stable  Last Vitals:  Vitals Value Taken Time  BP 115/75 05/24/21 1052  Temp    Pulse 77 05/24/21 1056  Resp 15 05/24/21 1056  SpO2 98 % 05/24/21 1056  Vitals shown include unvalidated device data.  Last Pain:  Vitals:   05/24/21 0628  TempSrc: Oral  PainSc: 2          Complications: No notable events documented.

## 2021-05-24 NOTE — H&P (Signed)
The patient has been re-examined, and the chart reviewed, and there have been no interval changes to the documented history and physical.    The risks, benefits, and alternatives have been discussed at length. The patient expressed understanding of the risks benefits and agreed with plans for surgical intervention.  Mechille Varghese P. Janaisa Birkland, Jr. M.D.    

## 2021-05-24 NOTE — Anesthesia Preprocedure Evaluation (Signed)
Anesthesia Evaluation  Patient identified by MRN, date of birth, ID band Patient awake    Reviewed: Allergy & Precautions, NPO status , Patient's Chart, lab work & pertinent test results  History of Anesthesia Complications Negative for: history of anesthetic complications  Airway Mallampati: II  TM Distance: >3 FB Neck ROM: full    Dental  (+) Chipped   Pulmonary neg pulmonary ROS, neg shortness of breath,    Pulmonary exam normal        Cardiovascular Exercise Tolerance: Good hypertension, (-) Past MI Normal cardiovascular exam     Neuro/Psych  Headaches, negative psych ROS   GI/Hepatic Neg liver ROS, GERD  Medicated and Controlled,  Endo/Other  negative endocrine ROS  Renal/GU      Musculoskeletal   Abdominal   Peds  Hematology negative hematology ROS (+)   Anesthesia Other Findings Past Medical History: No date: Actinic keratosis No date: Anemia No date: Colon adenomas No date: De Quervain's tenosynovitis No date: GERD (gastroesophageal reflux disease) No date: Headache No date: Hyperlipidemia No date: Hypertension No date: Rheumatoid arthritis ([place])  Past Surgical History: No date: APPENDECTOMY No date: COLONOSCOPY 06/17/2017: COLONOSCOPY WITH PROPOFOL; N/A     Comment:  Procedure: COLONOSCOPY WITH PROPOFOL;  Surgeon: Manya Silvas, MD;  Location: [facility] ENDOSCOPY;  Service:               Endoscopy;  Laterality: N/A; No date: FLEXIBLE SIGMOIDOSCOPY  BMI    Body Mass Index: 22.83 kg/m      Reproductive/Obstetrics negative OB ROS                             Anesthesia Physical Anesthesia Plan  ASA: 3  Anesthesia Plan: Spinal   Post-op Pain Management:    Induction:   PONV Risk Score and Plan:   Airway Management Planned: Natural Airway and Nasal Cannula  Additional Equipment:   Intra-op Plan:   Post-operative Plan:   Informed Consent: I  have reviewed the patients History and Physical, chart, labs and discussed the procedure including the risks, benefits and alternatives for the proposed anesthesia with the patient or authorized representative who has indicated his/her understanding and acceptance.     Dental Advisory Given  Plan Discussed with: Anesthesiologist, CRNA and Surgeon  Anesthesia Plan Comments: (Patient reports no bleeding problems and no anticoagulant use.  Plan for spinal with backup GA  Patient consented for risks of anesthesia including but not limited to:  - adverse reactions to medications - damage to eyes, teeth, lips or other oral mucosa - nerve damage due to positioning  - risk of bleeding, infection and or nerve damage from spinal that could lead to paralysis - risk of headache or failed spinal - damage to teeth, lips or other oral mucosa - sore throat or hoarseness - damage to heart, brain, nerves, lungs, other parts of body or loss of life  Patient voiced understanding.)        Anesthesia Quick Evaluation

## 2021-05-24 NOTE — Op Note (Signed)
OPERATIVE NOTE  DATE OF SURGERY:  05/24/2021  PATIENT NAME:  BRENIYA GOERTZEN   DOB: 1951/08/08  MRN: [id number]  PRE-OPERATIVE DIAGNOSIS: Degenerative arthrosis of the left knee, primary  POST-OPERATIVE DIAGNOSIS:  Same  PROCEDURE:  Left total knee arthroplasty using computer-assisted navigation  SURGEON:  Marciano Sequin. M.D.  ANESTHESIA: spinal  ESTIMATED BLOOD LOSS: 50 mL  FLUIDS REPLACED: 1000 mL of crystalloid  TOURNIQUET TIME: 81 minutes  DRAINS: 2 medium Hemovac drains  SOFT TISSUE RELEASES: Anterior cruciate ligament, posterior cruciate ligament, deep  medial collateral ligament, patellofemoral ligament  IMPLANTS UTILIZED: DePuy Attune size 4N posterior stabilized femoral component (cemented), size 3 rotating platform tibial component (cemented), 35 mm medialized dome patella (cemented), and a 7 mm stabilized rotating platform polyethylene insert.  INDICATIONS FOR SURGERY: CHELCEE KORPI is a 70 y.o. year old female with a long history of progressive knee pain. X-rays demonstrated severe degenerative changes in tricompartmental fashion. The patient had not seen any significant improvement despite conservative nonsurgical intervention. After discussion of the risks and benefits of surgical intervention, the patient expressed understanding of the risks benefits and agree with plans for total knee arthroplasty.   The risks, benefits, and alternatives were discussed at length including but not limited to the risks of infection, bleeding, nerve injury, stiffness, blood clots, the need for revision surgery, cardiopulmonary complications, among others, and they were willing to proceed.  PROCEDURE IN DETAIL: The patient was brought into the operating room and, after adequate spinal anesthesia was achieved, a tourniquet was placed on the patient's upper thigh. The patient's knee and leg were cleaned and prepped with alcohol and DuraPrep and draped in the usual sterile fashion. A  "timeout" was performed as per usual protocol. The lower extremity was exsanguinated using an Esmarch, and the tourniquet was inflated to 300 mmHg. An anterior longitudinal incision was made followed by a standard mid vastus approach. The deep fibers of the medial collateral ligament were elevated in a subperiosteal fashion off of the medial flare of the tibia so as to maintain a continuous soft tissue sleeve. The patella was subluxed laterally and the patellofemoral ligament was incised. Inspection of the knee demonstrated severe degenerative changes with full-thickness loss of articular cartilage. Osteophytes were debrided using a rongeur. Anterior and posterior cruciate ligaments were excised. Two 4.0 mm Schanz pins were inserted in the femur and into the tibia for attachment of the array of trackers used for computer-assisted navigation. Hip center was identified using a circumduction technique. Distal landmarks were mapped using the computer. The distal femur and proximal tibia were mapped using the computer. The distal femoral cutting guide was positioned using computer-assisted navigation so as to achieve a 5 distal valgus cut. The femur was sized and it was felt that a size 4N femoral component was appropriate. A size 4 femoral cutting guide was positioned and the anterior cut was performed and verified using the computer. This was followed by completion of the posterior and chamfer cuts. Femoral cutting guide for the central box was then positioned in the center box cut was performed.  Attention was then directed to the proximal tibia. Medial and lateral menisci were excised. The extramedullary tibial cutting guide was positioned using computer-assisted navigation so as to achieve a 0 varus-valgus alignment and 3 posterior slope. The cut was performed and verified using the computer. The proximal tibia was sized and it was felt that a size 3 tibial tray was appropriate. Tibial and femoral trials were  inserted  followed by insertion of a 7 mm polyethylene insert. This allowed for excellent mediolateral soft tissue balancing both in flexion and in full extension. Finally, the patella was cut and prepared so as to accommodate a 35 mm medialized dome patella. A patella trial was placed and the knee was placed through a range of motion with excellent patellar tracking appreciated. The femoral trial was removed after debridement of posterior osteophytes. The central post-hole for the tibial component was reamed followed by insertion of a keel punch. Tibial trials were then removed. Cut surfaces of bone were irrigated with copious amounts of normal saline using pulsatile lavage and then suctioned dry. Polymethylmethacrylate cement with gentamicin was prepared in the usual fashion using a vacuum mixer. Cement was applied to the cut surface of the proximal tibia as well as along the undersurface of a size 3 rotating platform tibial component. Tibial component was positioned and impacted into place. Excess cement was removed using [employer] fast streamer. Cement was then applied to the cut surfaces of the femur as well as along the posterior flanges of the size 4N femoral component. The femoral component was positioned and impacted into place. Excess cement was removed using [employer] fast streamer. A 7 mm polyethylene trial was inserted and the knee was brought into full extension with steady axial compression applied. Finally, cement was applied to the backside of a 35 mm medialized dome patella and the patellar component was positioned and patellar clamp applied. Excess cement was removed using [employer] fast streamer. After adequate curing of the cement, the tourniquet was deflated after a total tourniquet time of 81 minutes. Hemostasis was achieved using electrocautery. The knee was irrigated with copious amounts of normal saline using pulsatile lavage followed by 500 ml of Surgiphor and then suctioned dry. 20 mL of 1.3% Exparel and 60 mL of  0.25% Marcaine in 40 mL of normal saline was injected along the posterior capsule, medial and lateral gutters, and along the arthrotomy site. A 7 mm stabilized rotating platform polyethylene insert was inserted and the knee was placed through a range of motion with excellent mediolateral soft tissue balancing appreciated and excellent patellar tracking noted. 2 medium drains were placed in the wound bed and brought out through separate stab incisions. The medial parapatellar portion of the incision was reapproximated using interrupted sutures of #1 Vicryl. Subcutaneous tissue was approximated in layers using first #0 Vicryl followed #2-0 Vicryl. The skin was approximated with skin staples. A sterile dressing was applied.  The patient tolerated the procedure well and was transported to the recovery room in stable condition.    Aliviya Schoeller P. Holley Bouche., M.D.

## 2021-05-24 NOTE — H&P (Signed)
ORTHOPAEDIC HISTORY & PHYSICAL Gwenlyn Fudge, [place] - 05/16/2021 10:45 AM EDT Formatting of this note is different from the original. [place] MEDICINE Chief Complaint:   Chief Complaint  Patient presents with   Knee Pain  H & P LEFT KNEE   History of Present Illness:   Deborah Singh is a 70 y.o. female that presents to clinic today for her preoperative history and evaluation. Patient presents with her husband. The patient is scheduled to undergo a left total knee arthroplasty on 05/24/21 by Dr. Marry Guan. Her pain began several years ago. The pain is located along the medial aspect of the knee. She describes her pain as worse with weightbearing. She reports associated swelling with some giving way of the knee. She denies associated numbness or tingling, denies locking.   The patient's symptoms have progressed to the point that they decrease her quality of life. The patient has previously undergone conservative treatment including NSAIDS and injections to the knee without adequate control of her symptoms.  Denies history of lumbar surgery, blood clots, or significant cardiac history.  Patient has a history of rheumatoid arthritis and takes Humira and methotrexate. Patient also reports a penicillin allergy. She states she last took it as a child and it made difficult for her to breathe.  Past Medical, Surgical, Family, Social History, Allergies, Medications:   Past Medical History:  Past Medical History:  Diagnosis Date   Dupuytren's contracture of hand   GERD (gastroesophageal reflux disease)   Hyperlipidemia   Hypertension   Rheumatoid arthritis with rheumatoid factor ([place]) 03/31/2014  a. Erosive, seropositive, positive anti-CCP antibodies. b. S/p Sulfasalazine c. Methotrexate   Past Surgical History:  Past Surgical History:  Procedure Laterality Date   APPENDECTOMY   COLONOSCOPY 04/01/1993  Normal Colon   COLONOSCOPY 03/01/2007   Adenomatous Polyp, FH Colon Polyps (Mother)   COLONOSCOPY 04/05/2012  PH Adenomatous Polyp, FH Colon Polyps (Mother): CBF 04/2017; Sch'ed 06/17/2017   COLONOSCOPY 06/17/2017  PH Adenomatous Polyp, FH Colon Polyps (Mother): CBF 06/2022   FLEXIBLE SIGMOIDOSCOPY 03/20/1998  Normal   Tdap 03/2009   Current Medications:  Current Outpatient Medications  Medication Sig Dispense Refill   adalimumab (HUMIRA PEN) 40 mg/0.8 mL pen injector kit Inject 40 mg subcutaneously every 14 (fourteen) days   calcipotriene (DOVONEX) 0.005 % topical solution Apply 1 Application topically as needed   cetirizine (ZYRTEC) 10 MG tablet Take 10 mg by mouth once daily as needed for Allergies   cholecalciferol (VITAMIN D3) 1,000 unit capsule Take 1,000 Units by mouth once daily.   cyanocobalamin, vitamin B-12, 3,000 mcg Cap Take 2 capsules by mouth once daily   efinaconazole (JUBLIA) 10 % SolA Apply to affected toenails every night.   folic acid (FOLVITE) 782 MCG tablet Take 800 mcg by mouth once daily   KRILL OIL ORAL Take 1 capsule by mouth once daily as needed   Lactobacillus acidophilus (PROBIOTIC ORAL) Take by mouth once daily   lisinopriL-hydrochlorothiazide (ZESTORETIC) 10-12.5 mg tablet Take 1 tablet by mouth once daily 90 tablet 3   melatonin 10 mg TbDL Take 2 Gum by mouth at bedtime   methotrexate (RHEUMATREX) 2.5 MG tablet Take 8 tablets (20 mg total) by mouth every 7 (seven) days   multivitamin tablet Take 2 tablets by mouth once daily   omeprazole (PRILOSEC) 20 MG DR capsule Take 1 capsule by mouth once daily 90 capsule 0   peg 400-propylene glycol (SYSTANE ULTRA) 0.4-0.3 % drops Apply 1  drop to eye as needed   polysorbate 80/glycerin (REFRESH DRY EYE THERAPY OPHTH) Apply 1 drop to eye as needed   simvastatin (ZOCOR) 20 MG tablet Take 1 tablet by mouth once daily 90 tablet 3   sodium chloride (OCEAN) 0.65 % nasal spray Place 2 sprays into one nostril every other day   sulindac (CLINORIL) 150 MG tablet  Take 1 tablet (150 mg total) by mouth 2 (two) times daily as needed (Pain)   No current facility-administered medications for this visit.   Allergies:  Allergies  Allergen Reactions   Penicillin V Potassium Shortness Of Breath   Social History:  Social History   Socioeconomic History   Marital status: Married  Spouse name: Keturah Yerby   Number of children: 2   Years of education: 16   Highest education level: Bachelor's degree (e.g., BA, AB, BS)  Occupational History   Occupation: Retired- IT  Tobacco Use   Smoking status: Never Smoker   Smokeless tobacco: Never Used  Scientific laboratory technician Use: Never used  Substance and Sexual Activity   Alcohol use: Not Currently   Drug use: No   Sexual activity: Defer  Partners: Male   Family History:  Family History  Problem Relation Age of Onset   Allergies Mother   Colon polyps Mother   Diabetes Father   Coronary Artery Disease (Blocked arteries around heart) Father 83   Kidney disease Father   High blood pressure (Hypertension) Father   Colon cancer Paternal Aunt   Review of Systems:   A 10+ ROS was performed, reviewed, and the pertinent orthopaedic findings are documented in the HPI.   Physical Examination:   BP 124/70 (BP Location: Left upper arm, Patient Position: Sitting, BP Cuff Size: Adult)  Ht 162.6 cm ($RemoveB'5\' 4"'qbTThVyQ$ )  Wt 60.9 kg (134 lb 3.2 oz)  BMI 23.04 kg/m   Patient is a well-developed, well-nourished female in no acute distress. Patient has normal mood and affect. Patient is alert and oriented to person, place, and time.   HEENT: Atraumatic, normocephalic. Pupils equal and reactive to light. Extraocular motion intact. Noninjected sclera.  Cardiovascular: Regular rate and rhythm, with no murmurs, rubs, or gallops. Distal pulses palpable. No carotid bruits.  Respiratory: Lungs clear to auscultation bilaterally.   Left Knee: Soft tissue swelling: minimal Effusion: none Erythema: none Crepitance:  mild Tenderness: medial Alignment: relative varus Mediolateral laxity: medial pseudolaxity Posterior sag: negative Patellar tracking: Good tracking without evidence of subluxation or tilt Atrophy: No significant atrophy.  Quadriceps tone was fair to good. Range of motion: 0/2/140 degrees 2  Sensation intact over the saphenous, lateral sural cutaneous, superficial fibular, and deep fibular nerve distributions.  Tests Performed/Reviewed:  X-rays  3 views of the left knee were obtained. Images reveal moderate to severe loss of medial compartment joint space with osteophyte formation. No fractures or dislocations. No other osseous abnormality noted.  I personally ordered and interpreted today's x-rays.  Impression:   ICD-10-CM  1. Primary osteoarthritis of left knee M17.12   Plan:   The patient has end-stage degenerative changes of the left knee. It was explained to the patient that the condition is progressive in nature. Having failed conservative treatment, the patient has elected to proceed with a total joint arthroplasty. The patient will undergo a total joint arthroplasty with Dr. Marry Guan. The risks of surgery, including blood clot and infection, were discussed with the patient. Measures to reduce these risks, including the use of anticoagulation, perioperative antibiotics, and early ambulation  were discussed. The importance of postoperative physical therapy was discussed with the patient. The patient elects to proceed with surgery. The patient is instructed to stop all blood thinners prior to surgery. The patient is instructed to call the hospital the day before surgery to learn of the proper arrival time.   Contact our office with any questions or concerns. Follow up as indicated, or sooner should any new problems arise, if conditions worsen, or if they are otherwise concerned.   Gwenlyn Fudge, [facility] [place], [place] [address] Phone: [phone]  This note was generated in part with voice recognition software and I apologize for any typographical errors that were not detected and corrected.  Electronically signed by Gwenlyn Fudge, PA at 05/16/2021 1:34 PM EDT

## 2021-05-25 DIAGNOSIS — M1712 Unilateral primary osteoarthritis, left knee: Secondary | ICD-10-CM | POA: Diagnosis not present

## 2021-05-25 MED ORDER — CELECOXIB 200 MG PO CAPS: 200.0000 mg | ORAL_CAPSULE | Freq: Two times a day (BID) | ORAL | 0 refills | Status: DC

## 2021-05-25 MED ORDER — ACETAMINOPHEN 500 MG PO TABS: 500.0000 mg | ORAL_TABLET | Freq: Four times a day (QID) | ORAL | 0 refills | Status: DC | PRN

## 2021-05-25 MED ORDER — ONDANSETRON HCL 4 MG PO TABS: 4.0000 mg | ORAL_TABLET | Freq: Four times a day (QID) | ORAL | 0 refills | Status: DC | PRN

## 2021-05-25 MED ORDER — TRAMADOL HCL 50 MG PO TABS: 50.0000 mg | ORAL_TABLET | Freq: Four times a day (QID) | ORAL | 0 refills | Status: DC | PRN

## 2021-05-25 MED ORDER — ENOXAPARIN SODIUM 40 MG/0.4ML IJ SOSY: 40.0000 mg | PREFILLED_SYRINGE | INTRAMUSCULAR | 0 refills | Status: DC

## 2021-05-25 MED ORDER — OXYCODONE HCL 5 MG PO TABS: 5.0000 mg | ORAL_TABLET | ORAL | 0 refills | Status: DC | PRN

## 2021-05-25 NOTE — Evaluation (Signed)
Occupational Therapy Evaluation Patient Details Name: Deborah Singh MRN: [id number] DOB: [birth year] Today's Date: 05/25/2021   History of Present Illness Pt is a 70 yo female s/p L TKA, WBAT. PMH of HTN, GERD.   Clinical Impression   Pt seen for OT evaluation this date, POD#1 from above surgery. Pt was independent in all ADLs prior to surgery. Pt is eager to return to PLOF with less pain and improved safety and independence. Pt currently requires minimal assist for LB dressing while in seated position due to limited AROM of knee. Pt instructed in polar care mgt, falls prevention strategies, home/routines modifications, DME/AE for LB bathing and dressing tasks, and compression stocking mgt. Pt would benefit from skilled OT services including additional instruction in dressing techniques with or without assistive devices for dressing and bathing skills to support recall and carryover prior to discharge and ultimately to maximize safety, independence, and minimize falls risk and caregiver burden. Do not currently anticipate any OT needs following this hospitalization.         Recommendations for follow up therapy are one component of a multi-disciplinary discharge planning process, led by the attending physician.  Recommendations may be updated based on patient status, additional functional criteria and insurance authorization.   Follow Up Recommendations  No OT follow up    Equipment Recommendations  3 in 1 bedside commode    Recommendations for Other Services       Precautions / Restrictions Precautions Precautions: Fall;Knee Precaution Booklet Issued: Yes (comment) Restrictions Weight Bearing Restrictions: Yes LLE Weight Bearing: Weight bearing as tolerated      Mobility Bed Mobility Overal bed mobility: Needs Assistance Bed Mobility: Supine to Sit     Supine to sit: Supervision;HOB elevated     General bed mobility comments: Increased time/effort    Transfers Overall  transfer level: Needs assistance Equipment used: Rolling walker (2 wheeled) Transfers: Sit to/from Stand Sit to Stand: Supervision         General transfer comment: Initially CGA progressing to supervision with RW; verbal cues for hand placement    Balance Overall balance assessment: Needs assistance Sitting-balance support: Feet supported Sitting balance-Leahy Scale: Normal     Standing balance support: Bilateral upper extremity supported;During functional activity Standing balance-Leahy Scale: Good Standing balance comment: reliant on UE support for mobility                           ADL either performed or assessed with clinical judgement   ADL Overall ADL's : Needs assistance/impaired Eating/Feeding: Independent   Grooming: Wash/dry hands;Wash/dry face;Oral care;Standing;Modified independent               Lower Body Dressing: Modified independent Lower Body Dressing Details (indicate cue type and reason): with sock aid Toilet Transfer: Modified Independent Toilet Transfer Details (indicate cue type and reason): simulated         Functional mobility during ADLs: Modified independent General ADL Comments: with RW     Vision Baseline Vision/History: 1 Wears glasses       Perception     Praxis      Pertinent Vitals/Pain Pain Assessment: 0-10 Pain Score: 2  Pain Descriptors / Indicators: Sore Pain Intervention(s): Monitored during session;Repositioned     Hand Dominance     Extremity/Trunk Assessment Upper Extremity Assessment Upper Extremity Assessment: Overall WFL for tasks assessed   Lower Extremity Assessment Lower Extremity Assessment: Generalized weakness   Cervical / Trunk Assessment Cervical / Trunk  Assessment: Normal   Communication Communication Communication: No difficulties   Cognition Arousal/Alertness: Awake/alert Behavior During Therapy: WFL for tasks assessed/performed Overall Cognitive Status: Within Functional  Limits for tasks assessed                                     General Comments       Exercises Other Exercises Other Exercises: Participated in bed mobility, transfers, gait, and stair training. Initially CGA progressing to supervision for safety. Other Exercises: Pt educated regarding: PT role/POC, DC recommendations, stair training, pain management, ice machine management. She verbalized understanding.   Shoulder Instructions      Home Living Family/patient expects to be discharged to:: Private residence Living Arrangements: Spouse/significant other Available Help at Discharge: Family;Available 24 hours/day   Home Access: Stairs to enter Entrance Stairs-Number of Steps: 3 Entrance Stairs-Rails: Right Home Layout: Two level;Able to live on main level with bedroom/bathroom Alternate Level Stairs-Number of Steps: half bath on first floor   Bathroom Shower/Tub: Tub/shower unit   Bathroom Toilet: Handicapped height     Home Equipment: Walker - 2 wheels          Prior Functioning/Environment Level of Independence: Independent                 OT Problem List: Decreased strength;Decreased activity tolerance;Impaired balance (sitting and/or standing)      OT Treatment/Interventions: Self-care/ADL training;Visual/perceptual remediation/compensation;Therapeutic exercise;Neuromuscular education;Therapeutic activities;DME and/or AE instruction    OT Goals(Current goals can be found in the care plan section) Acute Rehab OT Goals Patient Stated Goal: to go home OT Goal Formulation: With patient ADL Goals Pt Will Perform Grooming: with modified independence;standing Pt Will Perform Lower Body Dressing: with modified independence Pt Will Transfer to Toilet: with modified independence Pt Will Perform Toileting - Clothing Manipulation and hygiene: with modified independence  OT Frequency: Min 1X/week   Barriers to D/C:            Co-evaluation               AM-PAC OT "6 Clicks" Daily Activity     Outcome Measure Help from another person eating meals?: None Help from another person taking care of personal grooming?: None Help from another person toileting, which includes using toliet, bedpan, or urinal?: A Little Help from another person bathing (including washing, rinsing, drying)?: A Little Help from another person to put on and taking off regular upper body clothing?: None Help from another person to put on and taking off regular lower body clothing?: A Little 6 Click Score: 21   End of Session Equipment Utilized During Treatment: Gait belt;Rolling walker  Activity Tolerance: Patient tolerated treatment well Patient left: in chair;with call bell/phone within reach;with chair alarm set  OT Visit Diagnosis: Unsteadiness on feet (R26.81);Muscle weakness (generalized) (M62.81)                Time: 0932-6712 OT Time Calculation (min): 15 min Charges:  OT General Charges $OT Visit: 1 Visit OT Evaluation $OT Eval Low Complexity: 1 Low OT Treatments $Self Care/Home Management : 8-22 mins  Shanon Payor, OTD OTR/L  05/25/21, 12:24 PM

## 2021-05-25 NOTE — Anesthesia Postprocedure Evaluation (Signed)
Anesthesia Post Note  Patient: Deborah Singh  Procedure(s) Performed: COMPUTER ASSISTED TOTAL KNEE ARTHROPLASTY - RNFA (Left: Knee)  Anesthesia Type: Spinal Anesthetic complications: no Comments: Patient discharged. Per nursing notes patient did fine.    No notable events documented.   Last Vitals:  Vitals:   05/25/21 0502 05/25/21 0743  BP: (!) 148/95 139/83  Pulse: 91 81  Resp: 18 15  Temp: 36.4 C 36.7 C  SpO2: 96% 97%    Last Pain:  Vitals:   05/25/21 0743  TempSrc: Oral  PainSc:                  Deno Etienne

## 2021-05-25 NOTE — Progress Notes (Signed)
Pt d/c home with husband via POV.  All d/c paperwork was reviewed with pt and she expressed understanding.  Bilateral thigh high TEDs were on at time of d/c. Pt left with all personal belongings including her bone foam, polar care and IS.  IV removed from RFA without issue.  Pt is aware of follow up appointments with PA and MD.

## 2021-05-25 NOTE — Progress Notes (Signed)
  Subjective: 1 Day Post-Op Procedure(s) (LRB): COMPUTER ASSISTED TOTAL KNEE ARTHROPLASTY - RNFA (Left) Patient reports pain as mild.   Patient is well, and has had no acute complaints or problems Plan is to go Home after hospital stay. Negative for chest pain and shortness of breath Fever: no Gastrointestinal:Negative for nausea and vomiting  Objective: Vital signs in last 24 hours: Temp:  [96.8 F (36 C)-98 F (36.7 C)] 98 F (36.7 C) (09/24 0743) Pulse Rate:  [59-97] 81 (09/24 0743) Resp:  [12-18] 15 (09/24 0743) BP: (91-148)/(57-95) 139/83 (09/24 0743) SpO2:  [93 %-100 %] 97 % (09/24 0743)  Intake/Output from previous day:  Intake/Output Summary (Last 24 hours) at 05/25/2021 1025 Last data filed at 05/25/2021 1004 Gross per 24 hour  Intake 2958.86 ml  Output 310 ml  Net 2648.86 ml    Intake/Output this shift: Total I/O In: 240 [P.O.:240] Out: 100 [Drains:100]  Labs: No results for input(s): HGB in the last 72 hours. No results for input(s): WBC, RBC, HCT, PLT in the last 72 hours. No results for input(s): NA, K, CL, CO2, BUN, CREATININE, GLUCOSE, CALCIUM in the last 72 hours. No results for input(s): LABPT, INR in the last 72 hours.   EXAM General - Patient is Alert, Appropriate, and Oriented Extremity - ABD soft Neurovascular intact Sensation intact distally Intact pulses distally Dorsiflexion/Plantar flexion intact No cellulitis present Compartment soft Dressing/Incision - Bulky dressing removed from the left knee.  Honeycomb without any drainage noted.  Hemovac with mild bloody drainage.  Hemovac removed and 4x4 with tegaderm applied. Motor Function - intact, moving foot and toes well on exam.  Abdomen soft with normal bowel sounds.  Past Medical History:  Diagnosis Date   Actinic keratosis    Anemia    Colon adenomas    De Quervain's tenosynovitis    GERD (gastroesophageal reflux disease)    Headache    Hyperlipidemia    Hypertension     Rheumatoid arthritis ([facility])     Assessment/Plan: 1 Day Post-Op Procedure(s) (LRB): COMPUTER ASSISTED TOTAL KNEE ARTHROPLASTY - RNFA (Left) Active Problems:   Total knee replacement status  Estimated body mass index is 22.83 kg/m as calculated from the following:   Height as of this encounter: 5\' 4"  (1.626 m).   Weight as of this encounter: 60.3 kg. Advance diet Up with therapy D/C IV fluids when tolerating po intake.  Vitals stable this morning. Bulky dressing and hemovac removed without complications. Patient has done extremely well with therapy. Patient is passing gas without pain.  Urinating without issue. Plan for discharge home this afternoon after afternoon session with therapy.  DVT Prophylaxis - Lovenox, Foot Pumps, and TED hose Weight-Bearing as tolerated to left leg  J. Cameron Proud, PA-C [facility] Orthopaedic Surgery 05/25/2021, 10:25 AM

## 2021-05-25 NOTE — Progress Notes (Signed)
Physical Therapy Treatment Patient Details Name: Deborah Singh MRN: [id number] DOB: [birth year] Today's Date: 05/25/2021   History of Present Illness Pt is a 70 yo female s/p L TKA, WBAT. PMH of HTN, GERD.    PT Comments    Pt tolerated treatment well today, and was able to improve overall assist levels, pain levels, quality of gait, ambulation distance, and activity tolerance since last session. Pt initially CGA for transfers and gait but progressed to supervision for safety in RW. Able to perform stair training with CGA for safety. Pt will continue to benefit from skilled acute PT services to address deficits for return to baseline function. Will continue to recommend HHPT with intermittent supervision at DC.    Recommendations for follow up therapy are one component of a multi-disciplinary discharge planning process, led by the attending physician.  Recommendations may be updated based on patient status, additional functional criteria and insurance authorization.  Follow Up Recommendations  Home health PT;Supervision - Intermittent     Equipment Recommendations  Other (comment) (tub bench)       Precautions / Restrictions Precautions Precautions: Fall;Knee Precaution Booklet Issued: Yes (comment) Restrictions Weight Bearing Restrictions: Yes LLE Weight Bearing: Weight bearing as tolerated     Mobility  Bed Mobility Overal bed mobility: Needs Assistance Bed Mobility: Supine to Sit     Supine to sit: Supervision;HOB elevated     General bed mobility comments: Increased time/effort    Transfers Overall transfer level: Needs assistance Equipment used: Rolling walker (2 wheeled) Transfers: Sit to/from Stand Sit to Stand: Supervision         General transfer comment: Initially CGA progressing to supervision with RW; verbal cues for hand placement  Ambulation/Gait Ambulation/Gait assistance: Supervision;Min guard Gait Distance (Feet): 200 Feet (133ft  x2) Assistive device: Rolling walker (2 wheeled)       General Gait Details: 178ft x2 (to stairs and back) with RW; antalgic gait improved with time, demonstrating improved heel strike, foot clearance, and step length. Verbal cues for RW proximity.   Stairs Stairs: Yes Stairs assistance: Min guard Stair Management: One rail Right;One rail Left Number of Stairs: 4 General stair comments: CGA for safety to navigate stairs ascending with RLE, descending with LLE. Unilateral UE support demonstrating step to gait pattern.      Balance Overall balance assessment: Needs assistance Sitting-balance support: Feet supported Sitting balance-Leahy Scale: Normal     Standing balance support: Bilateral upper extremity supported;During functional activity Standing balance-Leahy Scale: Good Standing balance comment: reliant on UE support for mobility           Cognition Arousal/Alertness: Awake/alert Behavior During Therapy: WFL for tasks assessed/performed Overall Cognitive Status: Within Functional Limits for tasks assessed           Exercises Other Exercises Other Exercises: Participated in bed mobility, transfers, gait, and stair training. Initially CGA progressing to supervision for safety. Other Exercises: Pt educated regarding: PT role/POC, DC recommendations, stair training, pain management, ice machine management. She verbalized understanding.        Pertinent Vitals/Pain Pain Assessment: 0-10 Pain Score: 2  Pain Descriptors / Indicators: Sore Pain Intervention(s): Monitored during session;Repositioned    Home Living Family/patient expects to be discharged to:: Private residence Living Arrangements: Spouse/significant other Available Help at Discharge: Family;Available 24 hours/day   Home Access: Stairs to enter Entrance Stairs-Rails: Right Home Layout: Two level;Able to live on main level with bedroom/bathroom Home Equipment: Gilford Rile - 2 wheels      Prior  Function  Level of Independence: Independent          PT Goals (current goals can now be found in the care plan section) Acute Rehab PT Goals Patient Stated Goal: to go home PT Goal Formulation: With patient Time For Goal Achievement: 06/07/21 Potential to Achieve Goals: Good Progress towards PT goals: Progressing toward goals    Frequency    BID      PT Plan Current plan remains appropriate       AM-PAC PT "6 Clicks" Mobility   Outcome Measure  Help needed turning from your back to your side while in a flat bed without using bedrails?: None Help needed moving from lying on your back to sitting on the side of a flat bed without using bedrails?: None Help needed moving to and from a bed to a chair (including a wheelchair)?: None Help needed standing up from a chair using your arms (e.g., wheelchair or bedside chair)?: None Help needed to walk in hospital room?: None Help needed climbing 3-5 steps with a railing? : A Little 6 Click Score: 23    End of Session Equipment Utilized During Treatment: Gait belt Activity Tolerance: Patient tolerated treatment well Patient left: in chair;with chair alarm set;with call bell/phone within reach (left in care of OT) Nurse Communication: Mobility status PT Visit Diagnosis: Other abnormalities of gait and mobility (R26.89);Difficulty in walking, not elsewhere classified (R26.2);Muscle weakness (generalized) (M62.81);Pain Pain - Right/Left: Left Pain - part of body: Knee     Time: 0175-1025 PT Time Calculation (min) (ACUTE ONLY): 26 min  Charges:  $Therapeutic Activity: 23-37 mins                     Herminio Commons, PT, DPT 12:21 PM,05/25/21

## 2021-05-25 NOTE — Discharge Summary (Signed)
Physician Discharge Summary  Patient ID: BREIONA COUVILLON MRN: [id number] DOB/AGE: 1951-06-17 70 y.o.  Admit date: 05/24/2021 Discharge date: 05/25/2021  Admission Diagnoses:  Total knee replacement status [Z96.659]  Discharge Diagnoses: Patient Active Problem List   Diagnosis Date Noted   De Quervain's tenosynovitis 05/24/2021   Dupuytren's contracture of hand 05/24/2021   GERD (gastroesophageal reflux disease) 05/24/2021   Hyperlipidemia 05/24/2021   Hypertension 05/24/2021   Total knee replacement status 05/24/2021   Primary osteoarthritis of both knees 03/31/2021   Bursitis of left shoulder 10/01/2015   Rotator cuff tendinitis, left 10/01/2015   Rheumatoid arthritis with rheumatoid factor ([place]) 03/31/2014    Past Medical History:  Diagnosis Date   Actinic keratosis    Anemia    Colon adenomas    De Quervain's tenosynovitis    GERD (gastroesophageal reflux disease)    Headache    Hyperlipidemia    Hypertension    Rheumatoid arthritis ([place])      Transfusion: None.   Consultants (if any):   Discharged Condition: Improved  Hospital Course: ALISEA MATTE is an 70 y.o. female who was admitted 05/24/2021 with a diagnosis of primary degenerative arthrosis of the left knee and went to the operating room on 05/24/2021 and underwent the above named procedures.    Surgeries: Procedure(s): COMPUTER ASSISTED TOTAL KNEE ARTHROPLASTY - RNFA on 05/24/2021 Patient tolerated the surgery well. Taken to PACU where she was stabilized and then transferred to the orthopedic floor.  Started on Lovenox 30mg  q 12 hrs. Foot pumps applied bilaterally at 80 mm. Heels elevated on bed with rolled towels. No evidence of DVT. Negative Homan. Physical therapy started on day #1 for gait training and transfer. OT started day #1 for ADL and assisted devices.  Patient's IV and hemovac were removed on POD1.  Foley was removed shortly after surgery.  Implants: DePuy Attune size 4N posterior  stabilized femoral component (cemented), size 3 rotating platform tibial component (cemented), 35 mm medialized dome patella (cemented), and a 7 mm stabilized rotating platform polyethylene insert.  She was given perioperative antibiotics:  Anti-infectives (From admission, onward)    Start     Dose/Rate Route Frequency Ordered Stop   05/24/21 1300  ceFAZolin (ANCEF) IVPB 2g/100 mL premix        2 g 200 mL/hr over 30 Minutes Intravenous Every 6 hours 05/24/21 1100 05/24/21 2142   05/24/21 0613  ceFAZolin (ANCEF) 2-4 GM/100ML-% IVPB       Note to Pharmacy: Sylvester Harder   : cabinet override      05/24/21 0613 05/24/21 0753   05/24/21 0600  ceFAZolin (ANCEF) IVPB 2g/100 mL premix        2 g 200 mL/hr over 30 Minutes Intravenous On call to O.R. 05/24/21 0211 05/24/21 0730     .  She was given sequential compression devices, early ambulation, and Lovenox for DVT prophylaxis.  She benefited maximally from the hospital stay and there were no complications.    Recent vital signs:  Vitals:   05/25/21 0502 05/25/21 0743  BP: (!) 148/95 139/83  Pulse: 91 81  Resp: 18 15  Temp: 97.6 F (36.4 C) 98 F (36.7 C)  SpO2: 96% 97%    Recent laboratory studies:  Lab Results  Component Value Date   HGB 13.3 05/14/2021   Lab Results  Component Value Date   WBC 4.5 05/14/2021   PLT 241 05/14/2021   Lab Results  Component Value Date   INR 1.0 05/14/2021  Lab Results  Component Value Date   NA 133 (L) 05/14/2021   K 4.1 05/14/2021   CL 99 05/14/2021   CO2 27 05/14/2021   BUN 19 05/14/2021   CREATININE 0.66 05/14/2021   GLUCOSE 98 05/14/2021    Discharge Medications:   Allergies as of 05/25/2021       Reactions   Penicillin V    IgE = 65 (WNL) on 05/14/2021        Medication List     TAKE these medications    acetaminophen 500 MG tablet Commonly known as: TYLENOL Take 1-2 tablets (500-1,000 mg total) by mouth every 6 (six) hours as needed.   celecoxib 200 MG  capsule Commonly known as: CELEBREX Take 1 capsule (200 mg total) by mouth 2 (two) times daily.   Cholecalciferol 25 MCG (1000 UT) Tbdp Take 1,000 Units by mouth daily.   clindamycin-benzoyl peroxide gel Commonly known as: BenzaClin with Pump Apply to face once a day for acne. What changed:  how much to take how to take this when to take this reasons to take this additional instructions   enoxaparin 40 MG/0.4ML injection Commonly known as: LOVENOX Inject 0.4 mLs (40 mg total) into the skin daily.   folic acid 626 MCG tablet Commonly known as: FOLVITE Take 1,600 mcg by mouth daily.   Humira Pen 40 MG/0.8ML Pnkt Generic drug: Adalimumab Inject 40 mg into the skin every 14 (fourteen) days.   Jublia 10 % Soln Generic drug: Efinaconazole Apply to affected toenails every night. What changed:  how much to take how to take this when to take this reasons to take this additional instructions   Krill Oil 350 MG Caps Take 350 mg by mouth daily.   lisinopril-hydrochlorothiazide 10-12.5 MG tablet Commonly known as: ZESTORETIC Take 1 tablet by mouth daily.   MELATONIN GUMMIES PO Take 10 mg by mouth at bedtime. 2 gummies   methotrexate 2.5 MG tablet Take 20 mg by mouth once a week.   multivitamin tablet Take 2 tablets by mouth daily.   omeprazole 20 MG capsule Commonly known as: PRILOSEC Take 20 mg by mouth daily.   ondansetron 4 MG tablet Commonly known as: ZOFRAN Take 1 tablet (4 mg total) by mouth every 6 (six) hours as needed for nausea.   oxyCODONE 5 MG immediate release tablet Commonly known as: Oxy IR/ROXICODONE Take 1-2 tablets (5-10 mg total) by mouth every 4 (four) hours as needed for severe pain.   PROBIOTIC DAILY PO Take 1 tablet by mouth daily. 10 billion active cultures   REFRESH DRY EYE THERAPY OP Place 1 drop into both eyes daily as needed (Dry eye).   simvastatin 20 MG tablet Commonly known as: ZOCOR Take 20 mg by mouth daily.   sulindac  150 MG tablet Commonly known as: CLINORIL Take 150 mg by mouth daily as needed (inflammation).   Systane 0.4-0.3 % Soln Generic drug: Polyethyl Glycol-Propyl Glycol Place 1 drop into both eyes daily as needed (Dry eye).   traMADol 50 MG tablet Commonly known as: ULTRAM Take 1-2 tablets (50-100 mg total) by mouth every 6 (six) hours as needed for moderate pain.   VITAMIN B 12 PO Take 3,000 mcg by mouth daily. 2 gummies   Xlear Sinus Care Spray Soln Place 1 spray into the nose daily.               Durable Medical Equipment  (From admission, onward)  Start     Ordered   05/24/21 1219  DME Walker rolling  Once       Question:  Patient needs a walker to treat with the following condition  Answer:  Total knee replacement status   05/24/21 1218   05/24/21 1219  DME Bedside commode  Once       Question:  Patient needs a bedside commode to treat with the following condition  Answer:  Total knee replacement status   05/24/21 1218            Diagnostic Studies: DG Knee Left Port  Result Date: 05/24/2021 CLINICAL DATA:  Status post left total knee arthroplasty EXAM: PORTABLE LEFT KNEE - 1-2 VIEW COMPARISON:  None. FINDINGS: Status post left total knee arthroplasty with well-positioned distal femoral and proximal tibial prostheses. Surgical drain terminates over the superior right knee joint. Vertical midline anterior skin staples. No bone fracture or dislocation. No focal osseous lesions. Expected soft tissue gas within and surrounding the left knee joint. IMPRESSION: Satisfactory immediate postoperative appearance status post left total knee arthroplasty. Electronically Signed   By: Ilona Sorrel M.D.   On: 05/24/2021 11:32    Disposition: Plan for discharge home this afternoon pending progress with afternoon PT session.   Follow-up Information     Fausto Skillern, PA-C Follow up on 06/07/2021.   Specialty: Orthopedic Surgery Why: at 1:45pm Contact  information: [place] [address] [phone]         Dereck Leep, MD Follow up on 07/16/2021.   Specialty: Orthopedic Surgery Why: at 2:30pm Contact information: [place] [address] [phone]                Signed: Judson Roch PA-C 05/25/2021, 10:38 AM

## 2021-05-26 DIAGNOSIS — M1711 Unilateral primary osteoarthritis, right knee: Secondary | ICD-10-CM | POA: Diagnosis not present

## 2021-05-26 DIAGNOSIS — M654 Radial styloid tenosynovitis [de Quervain]: Secondary | ICD-10-CM | POA: Diagnosis not present

## 2021-05-26 DIAGNOSIS — M720 Palmar fascial fibromatosis [Dupuytren]: Secondary | ICD-10-CM | POA: Diagnosis not present

## 2021-05-26 DIAGNOSIS — Z471 Aftercare following joint replacement surgery: Secondary | ICD-10-CM | POA: Diagnosis not present

## 2021-05-26 DIAGNOSIS — I10 Essential (primary) hypertension: Secondary | ICD-10-CM | POA: Diagnosis not present

## 2021-05-26 DIAGNOSIS — E785 Hyperlipidemia, unspecified: Secondary | ICD-10-CM | POA: Diagnosis not present

## 2021-05-26 DIAGNOSIS — R32 Unspecified urinary incontinence: Secondary | ICD-10-CM | POA: Diagnosis not present

## 2021-05-26 DIAGNOSIS — K219 Gastro-esophageal reflux disease without esophagitis: Secondary | ICD-10-CM | POA: Diagnosis not present

## 2021-05-26 DIAGNOSIS — Z96652 Presence of left artificial knee joint: Secondary | ICD-10-CM | POA: Diagnosis not present

## 2021-05-26 DIAGNOSIS — M069 Rheumatoid arthritis, unspecified: Secondary | ICD-10-CM | POA: Diagnosis not present

## 2021-05-26 DIAGNOSIS — D649 Anemia, unspecified: Secondary | ICD-10-CM | POA: Diagnosis not present

## 2021-05-28 ENCOUNTER — Encounter: Payer: Self-pay | Admitting: Orthopedic Surgery

## 2021-05-29 NOTE — Progress Notes (Signed)
[facility]  Occupational Therapy Certification  Patient Details  Name: JALAINE RIGGENBACH MRN: [id number] Date of Birth: [birth year] Medical Diagnosis: Active Problems:   Total knee replacement status  Visit Diagnosis: OT Visit Diagnosis: Unsteadiness on feet (R26.81), Muscle weakness (generalized) (M62.81) OT Problem: Decreased strength, Decreased activity tolerance, Impaired balance (sitting and/or standing)  Goals: Pt Will Perform Grooming: with modified independence, standing Pt Will Perform Lower Body Dressing: with modified independence Pt Will Transfer to Toilet: with modified independence Pt Will Perform Toileting - Clothing Manipulation and hygiene: with modified independence  Duration: Services will be provided through the following date:    Frequency: Min 1X/week  Amount: One treatment session per day unless otherwise indicated:  Certification Start Date: 1/82/8833 Certification End Date:  06/08/2021   OT Treatments/Interventions: Self-care/ADL training, Visual/perceptual remediation/compensation, Therapeutic exercise, Neuromuscular education, Therapeutic activities, DME and/or AE instruction  Serita Degroote H. [place] Shark, PT, DPT, NCS 05/29/21, 9:40 AM [phone]

## 2021-05-30 DIAGNOSIS — Z471 Aftercare following joint replacement surgery: Secondary | ICD-10-CM | POA: Diagnosis not present

## 2021-06-02 DIAGNOSIS — Z96652 Presence of left artificial knee joint: Secondary | ICD-10-CM | POA: Diagnosis not present

## 2021-06-02 DIAGNOSIS — I10 Essential (primary) hypertension: Secondary | ICD-10-CM | POA: Diagnosis not present

## 2021-06-02 DIAGNOSIS — M1711 Unilateral primary osteoarthritis, right knee: Secondary | ICD-10-CM | POA: Diagnosis not present

## 2021-06-02 DIAGNOSIS — M654 Radial styloid tenosynovitis [de Quervain]: Secondary | ICD-10-CM | POA: Diagnosis not present

## 2021-06-02 DIAGNOSIS — Z471 Aftercare following joint replacement surgery: Secondary | ICD-10-CM | POA: Diagnosis not present

## 2021-06-02 DIAGNOSIS — M069 Rheumatoid arthritis, unspecified: Secondary | ICD-10-CM | POA: Diagnosis not present

## 2021-06-02 DIAGNOSIS — K219 Gastro-esophageal reflux disease without esophagitis: Secondary | ICD-10-CM | POA: Diagnosis not present

## 2021-06-02 DIAGNOSIS — M720 Palmar fascial fibromatosis [Dupuytren]: Secondary | ICD-10-CM | POA: Diagnosis not present

## 2021-06-02 DIAGNOSIS — E785 Hyperlipidemia, unspecified: Secondary | ICD-10-CM | POA: Diagnosis not present

## 2021-06-02 DIAGNOSIS — D649 Anemia, unspecified: Secondary | ICD-10-CM | POA: Diagnosis not present

## 2021-06-02 DIAGNOSIS — R32 Unspecified urinary incontinence: Secondary | ICD-10-CM | POA: Diagnosis not present

## 2021-06-04 ENCOUNTER — Encounter: Payer: PPO | Admitting: Dermatology

## 2021-06-07 DIAGNOSIS — Z96652 Presence of left artificial knee joint: Secondary | ICD-10-CM | POA: Diagnosis not present

## 2021-06-07 DIAGNOSIS — R531 Weakness: Secondary | ICD-10-CM | POA: Diagnosis not present

## 2021-06-07 DIAGNOSIS — M25662 Stiffness of left knee, not elsewhere classified: Secondary | ICD-10-CM | POA: Diagnosis not present

## 2021-06-07 DIAGNOSIS — M25562 Pain in left knee: Secondary | ICD-10-CM | POA: Diagnosis not present

## 2021-06-10 DIAGNOSIS — R531 Weakness: Secondary | ICD-10-CM | POA: Diagnosis not present

## 2021-06-10 DIAGNOSIS — M25562 Pain in left knee: Secondary | ICD-10-CM | POA: Diagnosis not present

## 2021-06-10 DIAGNOSIS — Z96652 Presence of left artificial knee joint: Secondary | ICD-10-CM | POA: Diagnosis not present

## 2021-06-10 DIAGNOSIS — M25662 Stiffness of left knee, not elsewhere classified: Secondary | ICD-10-CM | POA: Diagnosis not present

## 2021-06-12 DIAGNOSIS — Z96652 Presence of left artificial knee joint: Secondary | ICD-10-CM | POA: Diagnosis not present

## 2021-06-19 DIAGNOSIS — Z96652 Presence of left artificial knee joint: Secondary | ICD-10-CM | POA: Diagnosis not present

## 2021-06-19 DIAGNOSIS — M25662 Stiffness of left knee, not elsewhere classified: Secondary | ICD-10-CM | POA: Diagnosis not present

## 2021-06-19 DIAGNOSIS — R531 Weakness: Secondary | ICD-10-CM | POA: Diagnosis not present

## 2021-06-19 DIAGNOSIS — M25562 Pain in left knee: Secondary | ICD-10-CM | POA: Diagnosis not present

## 2021-06-21 DIAGNOSIS — Z96652 Presence of left artificial knee joint: Secondary | ICD-10-CM | POA: Diagnosis not present

## 2021-06-21 DIAGNOSIS — M25562 Pain in left knee: Secondary | ICD-10-CM | POA: Diagnosis not present

## 2021-06-21 DIAGNOSIS — R531 Weakness: Secondary | ICD-10-CM | POA: Diagnosis not present

## 2021-06-21 DIAGNOSIS — M25662 Stiffness of left knee, not elsewhere classified: Secondary | ICD-10-CM | POA: Diagnosis not present

## 2021-06-25 DIAGNOSIS — R531 Weakness: Secondary | ICD-10-CM | POA: Diagnosis not present

## 2021-06-25 DIAGNOSIS — M25662 Stiffness of left knee, not elsewhere classified: Secondary | ICD-10-CM | POA: Diagnosis not present

## 2021-06-25 DIAGNOSIS — Z96652 Presence of left artificial knee joint: Secondary | ICD-10-CM | POA: Diagnosis not present

## 2021-06-25 DIAGNOSIS — M25562 Pain in left knee: Secondary | ICD-10-CM | POA: Diagnosis not present

## 2021-06-27 DIAGNOSIS — R531 Weakness: Secondary | ICD-10-CM | POA: Diagnosis not present

## 2021-06-27 DIAGNOSIS — M25562 Pain in left knee: Secondary | ICD-10-CM | POA: Diagnosis not present

## 2021-06-27 DIAGNOSIS — Z96652 Presence of left artificial knee joint: Secondary | ICD-10-CM | POA: Diagnosis not present

## 2021-06-27 DIAGNOSIS — M25662 Stiffness of left knee, not elsewhere classified: Secondary | ICD-10-CM | POA: Diagnosis not present

## 2021-06-28 DIAGNOSIS — Z79899 Other long term (current) drug therapy: Secondary | ICD-10-CM | POA: Diagnosis not present

## 2021-06-28 DIAGNOSIS — M0579 Rheumatoid arthritis with rheumatoid factor of multiple sites without organ or systems involvement: Secondary | ICD-10-CM | POA: Diagnosis not present

## 2021-07-02 DIAGNOSIS — Z96652 Presence of left artificial knee joint: Secondary | ICD-10-CM | POA: Diagnosis not present

## 2021-07-02 DIAGNOSIS — M25562 Pain in left knee: Secondary | ICD-10-CM | POA: Diagnosis not present

## 2021-07-02 DIAGNOSIS — M25662 Stiffness of left knee, not elsewhere classified: Secondary | ICD-10-CM | POA: Diagnosis not present

## 2021-07-02 DIAGNOSIS — R531 Weakness: Secondary | ICD-10-CM | POA: Diagnosis not present

## 2021-07-04 DIAGNOSIS — M25562 Pain in left knee: Secondary | ICD-10-CM | POA: Diagnosis not present

## 2021-07-04 DIAGNOSIS — Z96652 Presence of left artificial knee joint: Secondary | ICD-10-CM | POA: Diagnosis not present

## 2021-07-04 DIAGNOSIS — M25662 Stiffness of left knee, not elsewhere classified: Secondary | ICD-10-CM | POA: Diagnosis not present

## 2021-07-04 DIAGNOSIS — R531 Weakness: Secondary | ICD-10-CM | POA: Diagnosis not present

## 2021-07-16 ENCOUNTER — Ambulatory Visit: Payer: PPO | Admitting: Dermatology

## 2021-07-16 ENCOUNTER — Other Ambulatory Visit: Payer: Self-pay

## 2021-07-16 DIAGNOSIS — D225 Melanocytic nevi of trunk: Secondary | ICD-10-CM

## 2021-07-16 DIAGNOSIS — L814 Other melanin hyperpigmentation: Secondary | ICD-10-CM | POA: Diagnosis not present

## 2021-07-16 DIAGNOSIS — Q828 Other specified congenital malformations of skin: Secondary | ICD-10-CM | POA: Diagnosis not present

## 2021-07-16 DIAGNOSIS — Z1283 Encounter for screening for malignant neoplasm of skin: Secondary | ICD-10-CM

## 2021-07-16 DIAGNOSIS — L720 Epidermal cyst: Secondary | ICD-10-CM

## 2021-07-16 DIAGNOSIS — D229 Melanocytic nevi, unspecified: Secondary | ICD-10-CM | POA: Diagnosis not present

## 2021-07-16 DIAGNOSIS — L578 Other skin changes due to chronic exposure to nonionizing radiation: Secondary | ICD-10-CM

## 2021-07-16 DIAGNOSIS — L820 Inflamed seborrheic keratosis: Secondary | ICD-10-CM

## 2021-07-16 DIAGNOSIS — L821 Other seborrheic keratosis: Secondary | ICD-10-CM

## 2021-07-16 DIAGNOSIS — Q825 Congenital non-neoplastic nevus: Secondary | ICD-10-CM

## 2021-07-16 DIAGNOSIS — L570 Actinic keratosis: Secondary | ICD-10-CM

## 2021-07-16 DIAGNOSIS — L219 Seborrheic dermatitis, unspecified: Secondary | ICD-10-CM

## 2021-07-16 DIAGNOSIS — B351 Tinea unguium: Secondary | ICD-10-CM | POA: Diagnosis not present

## 2021-07-16 DIAGNOSIS — D1800 Hemangioma unspecified site: Secondary | ICD-10-CM | POA: Diagnosis not present

## 2021-07-16 DIAGNOSIS — Z872 Personal history of diseases of the skin and subcutaneous tissue: Secondary | ICD-10-CM

## 2021-07-16 DIAGNOSIS — L738 Other specified follicular disorders: Secondary | ICD-10-CM

## 2021-07-16 DIAGNOSIS — Z96652 Presence of left artificial knee joint: Secondary | ICD-10-CM | POA: Diagnosis not present

## 2021-07-16 NOTE — Patient Instructions (Signed)

## 2021-07-16 NOTE — Progress Notes (Signed)
Follow-Up Visit   Subjective  Deborah Singh is a 70 y.o. female who presents for the following: Annual Exam (Patient here today for full body exam. She reports a knot at bottom right foot, she noticed 3 - 4 weeks ago. Patient reports that it doesn't hurt or bother her but would like checked. Patient reports some rough areas on forehead, and right forearm. She also reports some spots at scalp. ).  She has been using Jublia for toenail fungus with improvement noted.   Patient here for full body skin exam and skin cancer screening.   The following portions of the chart were reviewed this encounter and updated as appropriate:      Review of Systems: No other skin or systemic complaints except as noted in HPI or Assessment and Plan.   Objective  Well appearing patient in no apparent distress; mood and affect are within normal limits.  A full examination was performed including scalp, head, eyes, ears, nose, lips, neck, chest, axillae, abdomen, back, buttocks, bilateral upper extremities, bilateral lower extremities, hands, feet, fingers, toes, fingernails, and toenails. All findings within normal limits unless otherwise noted below.  bilateral 5th toenails Bilateral 5th toes with mild nail dystrophy (thickening, white discoloration)  right upper arm Violaceous speckled small plaque.  right lower abdomen 4 x 3 mm speckled brown macule   right lower flank 3 x 1 mm medium dark brown macule   left lateral abdomen 2 mm medium dark brown macule   right medial plantar foot 1.3 cm subcutaneous nodule.   Right Knee - Anterior 4.7 x 3.5 cm hypopigmented waxy patch with keratotic rim   Scalp Focal scaly areas crown scalp  central upper forehead x 1, right lateral calf x 1, right anterior thigh x 1, left elbow x 1, left upper arm x 1, right forearm x 3, left shoulder x 1, right shoulder x 1 (10) Erythematous thin papules/macules with gritty scale. Scalp clear today   Chest -  Medial (Center) Pink waxy stuck-on papule chest Waxy pink/white scaly plaque R pretibia  Assessment & Plan  Tinea unguium bilateral 5th toenails  Has improved, not clear  Continue Jublia 10% soln - apply to affected toenails every night.  Discussed can take 1 year for nails to clear      Related Medications Efinaconazole (JUBLIA) 10 % SOLN Apply to affected toenails every night.  Vascular birthmark right upper arm  Benign Observation.     Nevus (3) left lateral abdomen; right lower abdomen; right lower flank  Benign-appearing.  Stable. Observation.  Call clinic for new or changing lesions.  Recommend daily use of broad spectrum spf 30+ sunscreen to sun-exposed areas.    Epidermal inclusion cyst right medial plantar foot  Vrs other.  Benign-appearing. Asymptomatic but new lesion.  Refer to podiatrist to further evaluate and for possible excision if recommended.  [facility] to [facility] Caryl [facility] if she can get in next 6 weeks due to knee surgery in January      Porokeratosis Right Knee - Anterior  Benign, observe.    Seborrheic dermatitis Scalp  Seborrheic Dermatitis  -  is a chronic persistent rash characterized by pinkness and scaling most commonly of the mid face but also can occur on the scalp (dandruff), ears; mid chest, mid back and groin.  It tends to be exacerbated by stress and cooler weather.  People who have neurologic disease may experience new onset or exacerbation of existing seborrheic dermatitis.  The condition is not  curable but treatable and can be controlled.  Start Head and Shoulders shampoo massage in let sit few minutes and then rinse.  Hand out given to patient in office      Actinic keratosis (10) central upper forehead x 1, right lateral calf x 1, right anterior thigh x 1, left elbow x 1, left upper arm x 1, right forearm x 3, left shoulder x 1, right shoulder x 1  Actinic keratoses are precancerous spots that appear secondary to  cumulative UV radiation exposure/sun exposure over time. They are chronic with expected duration over 1 year. A portion of actinic keratoses will progress to squamous cell carcinoma of the skin. It is not possible to reliably predict which spots will progress to skin cancer and so treatment is recommended to prevent development of skin cancer.  Recommend daily broad spectrum sunscreen SPF 30+ to sun-exposed areas, reapply every 2 hours as needed.  Recommend staying in the shade or wearing long sleeves, sun glasses (UVA+UVB protection) and wide brim hats (4-inch brim around the entire circumference of the hat). Call for new or changing lesions.  Destruction of lesion - central upper forehead x 1, right lateral calf x 1, right anterior thigh x 1, left elbow x 1, left upper arm x 1, right forearm x 3, left shoulder x 1, right shoulder x 1  Destruction method: cryotherapy   Informed consent: discussed and consent obtained   Lesion destroyed using liquid nitrogen: Yes   Region frozen until ice ball extended beyond lesion: Yes   Outcome: patient tolerated procedure well with no complications   Post-procedure details: wound care instructions given   Additional details:  Prior to procedure, discussed risks of blister formation, small wound, skin dyspigmentation, or rare scar following cryotherapy. Recommend Vaseline ointment to treated areas while healing.   Inflamed seborrheic keratosis Chest - Medial [facility])  Recommend eucrissa twice daily to affected areas of chest and right pretibia. Samples given to patient in office  Recommend CeraVe Sa rough and bumpy cream or Amlactin cream to moisturize areas.  Information given to patient in office  Lentigines - Scattered tan macules - Due to sun exposure - Benign-appearing, observe - Recommend daily broad spectrum sunscreen SPF 30+ to sun-exposed areas, reapply every 2 hours as needed. - Call for any changes  Seborrheic Keratoses - Stuck-on, waxy,  tan-brown papules and/or plaques  - Benign-appearing - Discussed benign etiology and prognosis. - Observe - Call for any changes  Sebaceous Hyperplasia - Small yellow papules with a central dell - Benign - Observe  Melanocytic Nevi - Tan-brown and/or pink-flesh-colored symmetric macules and papules - Benign appearing on exam today - Observation - Call clinic for new or changing moles - Recommend daily use of broad spectrum spf 30+ sunscreen to sun-exposed areas.   Hemangiomas - Red papules - Discussed benign nature - Observe - Call for any changes  Actinic Damage - Chronic condition, secondary to cumulative UV/sun exposure - diffuse scaly erythematous macules with underlying dyspigmentation - Recommend daily broad spectrum sunscreen SPF 30+ to sun-exposed areas, reapply every 2 hours as needed.  - Staying in the shade or wearing long sleeves, sun glasses (UVA+UVB protection) and wide brim hats (4-inch brim around the entire circumference of the hat) are also recommended for sun protection.  - Call for new or changing lesions.  History of PreCancerous Actinic Keratosis  - site(s) of PreCancerous Actinic Keratosis clear today. At scalp - history of 11f/u with vitamin d with good results  -  these may recur and new lesions may form requiring treatment to prevent transformation into skin cancer - observe for new or changing spots and contact [place] for appointment if occur - photoprotection with sun protective clothing; sunglasses and broad spectrum sunscreen with SPF of at least 30 + and frequent self skin exams recommended - yearly exams by a dermatologist recommended for persons with history of PreCancerous Actinic Keratoses  Skin cancer screening performed today.  Return for 6 month ak and seb derm follow up, 1 year tbse . I, Ruthell Rummage, CMA, am acting as scribe for Brendolyn Patty, MD.  Documentation: I have reviewed the above documentation for accuracy and  completeness, and I agree with the above.  Brendolyn Patty MD

## 2021-07-23 NOTE — Addendum Note (Signed)
Addended by: Ruthell Rummage A on: 07/23/2021 08:22 AM   Modules accepted: Orders

## 2021-08-05 DIAGNOSIS — M0579 Rheumatoid arthritis with rheumatoid factor of multiple sites without organ or systems involvement: Secondary | ICD-10-CM | POA: Diagnosis not present

## 2021-08-05 DIAGNOSIS — M722 Plantar fascial fibromatosis: Secondary | ICD-10-CM | POA: Diagnosis not present

## 2021-08-31 NOTE — Discharge Instructions (Addendum)
Instructions after Total Knee Replacement   Deborah Singh., M.D.     Dept. of [facility]  [name], [name]  [address]  Phone: [phone]   Fax: [phone]    DIET: Drink plenty of non-alcoholic fluids. Resume your normal diet. Include foods high in fiber.  ACTIVITY:  You may use crutches or a walker with weight-bearing as tolerated, unless instructed otherwise. You may be weaned off of the walker or crutches by your Physical Therapist.  Do NOT place pillows under the knee. Anything placed under the knee could limit your ability to straighten the knee.   Continue doing gentle exercises. Exercising will reduce the pain and swelling, increase motion, and prevent muscle weakness.   Please continue to use the TED compression stockings for 6 weeks. You may remove the stockings at night, but should reapply them in the morning. Do not drive or operate any equipment until instructed.  WOUND CARE:  Continue to use the PolarCare or ice packs periodically to reduce pain and swelling. You may bathe or shower after the staples are removed at the first office visit following surgery.  MEDICATIONS: You may resume your regular medications. Please take the pain medication as prescribed on the medication. Do not take pain medication on an empty stomach. You have been given a prescription for a blood thinner (Lovenox or Coumadin). Please take the medication as instructed. (NOTE: After completing a 2 week course of Lovenox, take one Enteric-coated aspirin once a day. This along with elevation will help reduce the possibility of phlebitis in your operated leg.) Do not drive or drink alcoholic beverages when taking pain medications.  CALL THE OFFICE FOR: Temperature above 101 degrees Excessive bleeding or drainage on the dressing. Excessive swelling, coldness, or paleness of the toes. Persistent nausea and vomiting.  FOLLOW-UP:  You  should have an appointment to return to the office in 10-14 days after surgery. Arrangements have been made for continuation of Physical Therapy (either home therapy or outpatient therapy).   Information for Discharge Teaching: DO NOT [place] UNTIL Friday, 09/20/2021 (96 hours) EXPAREL (bupivacaine liposome injectable suspension)   Your surgeon or anesthesiologist gave you EXPAREL(bupivacaine) to help control your pain after surgery.  EXPAREL is a local anesthetic that provides pain relief by numbing the tissue around the surgical site. EXPAREL is designed to release pain medication over time and can control pain for up to 72 hours. Depending on how you respond to EXPAREL, you may require less pain medication during your recovery.  Possible side effects: Temporary loss of sensation or ability to move in the area where bupivacaine was injected. Nausea, vomiting, constipation Rarely, numbness and tingling in your mouth or lips, lightheadedness, or anxiety may occur. Call your doctor right away if you think you may be experiencing any of these sensations, or if you have other questions regarding possible side effects.  Follow all other discharge instructions given to you by your surgeon or nurse. Eat a healthy diet and drink plenty of water or other fluids.  If you return to the hospital for any reason within 96 hours following the administration of EXPAREL, it is important for health care providers to know that you have received this anesthetic. A teal colored band has been placed on your arm with the date, time and amount of EXPAREL you have received in order to alert and inform your health care providers. Please leave this armband in place  for the full 96 hours following administration, and then you may remove the band.

## 2021-09-03 ENCOUNTER — Other Ambulatory Visit: Payer: Self-pay

## 2021-09-03 ENCOUNTER — Other Ambulatory Visit
Admission: RE | Admit: 2021-09-03 | Discharge: 2021-09-03 | Disposition: A | Discharge status: Home / Self Care | Payer: PPO | Source: Ambulatory Visit | Attending: Orthopedic Surgery | Admitting: Orthopedic Surgery

## 2021-09-03 VITALS — BP 110/66 | HR 79 | Temp 98.1°F | Resp 20 | Ht 64.0 in | Wt 138.5 lb

## 2021-09-03 DIAGNOSIS — Z96651 Presence of right artificial knee joint: Secondary | ICD-10-CM | POA: Insufficient documentation

## 2021-09-03 DIAGNOSIS — M059 Rheumatoid arthritis with rheumatoid factor, unspecified: Secondary | ICD-10-CM | POA: Insufficient documentation

## 2021-09-03 DIAGNOSIS — Z01812 Encounter for preprocedural laboratory examination: Secondary | ICD-10-CM | POA: Insufficient documentation

## 2021-09-03 DIAGNOSIS — M1711 Unilateral primary osteoarthritis, right knee: Secondary | ICD-10-CM | POA: Insufficient documentation

## 2021-09-03 DIAGNOSIS — I10 Essential (primary) hypertension: Secondary | ICD-10-CM | POA: Insufficient documentation

## 2021-09-03 DIAGNOSIS — K219 Gastro-esophageal reflux disease without esophagitis: Secondary | ICD-10-CM | POA: Insufficient documentation

## 2021-09-03 LAB — CBC
HCT: 35.6 % — ABNORMAL LOW (ref 36.0–46.0)
Hemoglobin: 11.2 g/dL — ABNORMAL LOW (ref 12.0–15.0)
MCH: 24.1 pg — ABNORMAL LOW (ref 26.0–34.0)
MCHC: 31.5 g/dL (ref 30.0–36.0)
MCV: 76.7 fL — ABNORMAL LOW (ref 80.0–100.0)
Platelets: 282 10*3/uL (ref 150–400)
RBC: 4.64 MIL/uL (ref 3.87–5.11)
RDW: 17.7 % — ABNORMAL HIGH (ref 11.5–15.5)
WBC: 4.6 10*3/uL (ref 4.0–10.5)
nRBC: 0 % (ref 0.0–0.2)

## 2021-09-03 LAB — TYPE AND SCREEN
ABO/RH(D): B NEG
Antibody Screen: NEGATIVE

## 2021-09-03 LAB — COMPREHENSIVE METABOLIC PANEL
ALT: 15 U/L (ref 0–44)
AST: 23 U/L (ref 15–41)
Albumin: 4.1 g/dL (ref 3.5–5.0)
Alkaline Phosphatase: 79 U/L (ref 38–126)
Anion gap: 7 (ref 5–15)
BUN: 24 mg/dL — ABNORMAL HIGH (ref 8–23)
CO2: 27 mmol/L (ref 22–32)
Calcium: 9.6 mg/dL (ref 8.9–10.3)
Chloride: 106 mmol/L (ref 98–111)
Creatinine, Ser: 0.75 mg/dL (ref 0.44–1.00)
GFR, Estimated: >60 mL/min (ref >60)
Glucose, Bld: 60 mg/dL — ABNORMAL LOW (ref 70–99)
Potassium: 3.5 mmol/L (ref 3.5–5.1)
Sodium: 140 mmol/L (ref 135–145)
Total Bilirubin: 0.9 mg/dL (ref 0.3–1.2)
Total Protein: 7.2 g/dL (ref 6.5–8.1)

## 2021-09-03 LAB — PROTIME-INR
INR: 1.1 (ref 0.8–1.2)
Prothrombin Time: 14.1 seconds (ref 11.4–15.2)

## 2021-09-03 LAB — SURGICAL PCR SCREEN
MRSA, PCR: NEGATIVE
Staphylococcus aureus: NEGATIVE

## 2021-09-03 LAB — SEDIMENTATION RATE: Sed Rate: 9 mm/hr (ref 0–30)

## 2021-09-03 LAB — URINALYSIS, ROUTINE W REFLEX MICROSCOPIC
Bilirubin Urine: NEGATIVE
Glucose, UA: NEGATIVE mg/dL
Hgb urine dipstick: NEGATIVE
Ketones, ur: NEGATIVE mg/dL
Leukocytes,Ua: NEGATIVE
Nitrite: NEGATIVE
Protein, ur: NEGATIVE mg/dL
Specific Gravity, Urine: 1.025 (ref 1.005–1.030)
pH: 5 (ref 5.0–8.0)

## 2021-09-03 LAB — APTT: aPTT: 31 seconds (ref 24–36)

## 2021-09-03 LAB — C-REACTIVE PROTEIN: CRP: 0.7 mg/dL (ref <1.0)

## 2021-09-03 NOTE — Patient Instructions (Addendum)
Your procedure is scheduled on: Monday September 16, 2021. Report to Day Surgery inside [facility] 2nd floor. To find out your arrival time please call [phone] between 1PM - 3PM on Friday September 13, 2021.  Remember: Instructions that are not followed completely may result in serious medical risk,  up to and including death, or upon the discretion of your surgeon and anesthesiologist your  surgery may need to be rescheduled.     _X__ 1. Do not eat food after midnight the night before your procedure.                 No chewing gum or hard candies. You may drink clear liquids up to 2 hours                 before you are scheduled to arrive for your surgery- DO not drink clear                 liquids within 2 hours of the start of your surgery.                 Clear Liquids include:  water, apple juice without pulp, clear Gatorade, G2 or                  Gatorade Zero (avoid Red/Purple/Blue), Black Coffee or Tea (Do not add                 anything to coffee or tea).  __X__2. Complete the "Ensure Clear Pre-surgery Clear Carbohydrate Drink" provided to you, 2 hours before arrival. **If you  are diabetic you will be provided with an alternative drink, Gatorade Zero or G2.  __X__3.  On the morning of surgery brush your teeth with toothpaste and water, you                may rinse your mouth with mouthwash if you wish.  Do not swallow any toothpaste of mouthwash.     _X__ 4.  No Alcohol for 24 hours before or after surgery.   _X__ 5.  Do Not Smoke or use e-cigarettes For 24 Hours Prior to Your Surgery.                 Do not use any chewable tobacco products for at least 6 hours prior to                 Surgery.  _X__  6.  Do not use any recreational drugs (marijuana, cocaine, heroin, ecstasy, MDMA or other)                For at least one week prior to your surgery.  Combination of these drugs with anesthesia                May have life threatening  results.  __X__7.  Bring your medications with you on the day of surgery if instructed.   __X__  8.  Notify your doctor if there is any change in your medical condition      (cold, fever, infections).     Do not wear jewelry, make-up, hairpins, clips or nail polish. Do not wear lotions, powders, or perfumes. You may wear deodorant. Do not shave 48 hours prior to surgery. Men may shave face and neck. Do not bring valuables to the hospital.    [facility] is not responsible for any belongings or valuables.  Contacts, dentures or bridgework may not be worn into surgery. Leave  your suitcase in the car. After surgery it may be brought to your room. For patients admitted to the hospital, discharge time is determined by your treatment team.   Patients discharged the day of surgery will not be allowed to drive home.   Make arrangements for someone to be with you for the first 24 hours of your Same Day Discharge.    Please read over the following fact sheets that you were given:   Total Joint Packet    __X__ Take these medicines the morning of surgery with A SIP OF WATER:    1. omeprazole (PRILOSEC) 20 MG   2. simvastatin (ZOCOR) 20 MG  3.   4.  5.  6.  ____ Fleet Enema (as directed)   __X__ Use CHG Soap (or wipes) as directed  ____ Use Benzoyl Peroxide Gel as instructed  ____ Use inhalers on the day of surgery  ____ Stop metformin 2 days prior to surgery    ____ Take 1/2 of usual insulin dose the night before surgery. No insulin the morning          of surgery.   __X__ One Week before your surgery stop aspirin.  __X__ One Week prior to surgery- Stop Anti-inflammatories such as Ibuprofen, Aleve, Advil, Motrin, meloxicam (MOBIC), diclofenac, etodolac, ketorolac, Toradol, Daypro, piroxicam, Goody's or BC powders. OK TO USE TYLENOL IF NEEDED  __X__ Stop Adalimumab (HUMIRA PEN 2 weeks prior to your surgery.   __X__ One week prior to surgery stop ALL supplements until after  surgery.    ____ Bring C-Pap to the hospital.    If you have any questions regarding your pre-procedure instructions,  Please call Pre-admit Testing at [phone]

## 2021-09-10 DIAGNOSIS — M1711 Unilateral primary osteoarthritis, right knee: Secondary | ICD-10-CM | POA: Diagnosis not present

## 2021-09-12 ENCOUNTER — Other Ambulatory Visit
Admission: RE | Admit: 2021-09-12 | Discharge: 2021-09-12 | Disposition: A | Discharge status: Home / Self Care | Payer: PPO | Source: Ambulatory Visit | Attending: Orthopedic Surgery | Admitting: Orthopedic Surgery

## 2021-09-12 ENCOUNTER — Other Ambulatory Visit: Payer: Self-pay

## 2021-09-12 DIAGNOSIS — Z20822 Contact with and (suspected) exposure to covid-19: Secondary | ICD-10-CM | POA: Diagnosis not present

## 2021-09-12 DIAGNOSIS — Z01812 Encounter for preprocedural laboratory examination: Secondary | ICD-10-CM | POA: Insufficient documentation

## 2021-09-13 LAB — SARS CORONAVIRUS 2 (TAT 6-24 HRS): SARS Coronavirus 2: NEGATIVE

## 2021-09-15 NOTE — H&P (Signed)
ORTHOPAEDIC HISTORY & PHYSICAL Gwenlyn Fudge, [place] - 09/10/2021 11:00 AM EST Formatting of this note is different from the original. [facility] Chief Complaint:   Chief Complaint  Patient presents with   Knee Pain  H & P RIGHT KNEE   History of Present Illness:   Deborah Singh is a 71 y.o. female that presents to clinic today for her preoperative history and evaluation. Patient presents unaccompanied. The patient is scheduled to undergo a right total knee arthroplasty on 09/16/21 by Dr. Marry Guan. Her pain began several years ago. The pain is located primarily along the medial aspect of the knee. She describes her pain as worse with weightbearing and rising after sitting. She reports associated swelling with some giving way of the knee. She denies associated numbness or tingling, denies locking.   The patient's symptoms have progressed to the point that they decrease her quality of life. The patient has previously undergone conservative treatment including NSAIDS and injections to the knee without adequate control of her symptoms.  Of note, patient does report a penicillin allergy, states it caused shortness of breath. IgE was 65.   Patient also has a history of seropositive rheumatoid arthritis for which she takes Humira and methotrexate.  Past Medical, Surgical, Family, Social History, Allergies, Medications:   Past Medical History:  Past Medical History:  Diagnosis Date   Dupuytren's contracture of hand   GERD (gastroesophageal reflux disease)   Hyperlipidemia   Hypertension   Rheumatoid arthritis with rheumatoid factor ([place]) 03/31/2014  a. Erosive, seropositive, positive anti-CCP antibodies. b. S/p Sulfasalazine c. Methotrexate   Past Surgical History:  Past Surgical History:  Procedure Laterality Date   COLONOSCOPY 04/01/1993  Normal Colon   FLEXIBLE SIGMOIDOSCOPY 03/20/1998  Normal   COLONOSCOPY 03/01/2007  Adenomatous  Polyp, FH Colon Polyps (Mother)   Tdap 03/2009   COLONOSCOPY 04/05/2012  PH Adenomatous Polyp, FH Colon Polyps (Mother): CBF 04/2017; Sch'ed 06/17/2017   COLONOSCOPY 06/17/2017  PH Adenomatous Polyp, FH Colon Polyps (Mother): CBF 06/2022   Left total knee arthroplasty using computer-assisted navigation 05/24/2021  Dr Marry Guan   APPENDECTOMY   Current Medications:  Current Outpatient Medications  Medication Sig Dispense Refill   adalimumab (HUMIRA) 40 mg/0.4 mL pen injector kit Inject 40 mg subcutaneously every 14 (fourteen) days 3 kit 1   amoxicillin (AMOXIL) 500 MG capsule Take 4 capsules 1 hour before the dental procedure 4 capsule 1   aspirin 81 MG EC tablet Take 81 mg by mouth once daily   cetirizine (ZYRTEC) 10 MG tablet Take 10 mg by mouth once daily as needed for Allergies   cholecalciferol (VITAMIN D3) 1,000 unit capsule Take 1,000 Units by mouth once daily.   cyanocobalamin, vitamin B-12, 3,000 mcg Cap Take 2 capsules by mouth once daily   efinaconazole (JUBLIA) 10 % SolA Apply to affected toenails every night.   folic acid (FOLVITE) 292 MCG tablet Take 800 mcg by mouth once daily   KRILL OIL ORAL Take 1 capsule by mouth once daily as needed   Lactobacillus acidophilus (PROBIOTIC ORAL) Take by mouth once daily   lisinopriL-hydrochlorothiazide (ZESTORETIC) 10-12.5 mg tablet Take 1 tablet by mouth once daily 90 tablet 3   melatonin 10 mg TbDL Take 2 Gum by mouth at bedtime   methotrexate (RHEUMATREX) 2.5 MG tablet Take 8 tablets (20 mg total) by mouth every 7 (seven) days 96 tablet 1   multivitamin tablet Take 2 tablets by mouth once daily  omeprazole (PRILOSEC) 20 MG DR capsule Take 1 capsule (20 mg total) by mouth once daily 90 capsule 1   peg 400-propylene glycol (SYSTANE ULTRA) 0.4-0.3 % drops Apply 1 drop to eye as needed   polysorbate 80/glycerin (REFRESH DRY EYE THERAPY OPHTH) Apply 1 drop to eye as needed   simvastatin (ZOCOR) 20 MG tablet Take 1 tablet by mouth once daily  90 tablet 3   sodium chloride (OCEAN) 0.65 % nasal spray Place 2 sprays into one nostril every other day   sulindac (CLINORIL) 150 MG tablet Take 1 tablet (150 mg total) by mouth 2 (two) times daily as needed (Pain) (Patient not taking: Reported on 09/10/2021)   No current facility-administered medications for this visit.   Allergies:  Allergies  Allergen Reactions   Penicillin V Potassium Shortness Of Breath   Social History:  Social History   Socioeconomic History   Marital status: Married  Spouse name: Deborah Singh   Number of children: 2   Years of education: 16   Highest education level: Bachelor's degree (e.g., BA, AB, BS)  Occupational History   Occupation: Retired- IT  Tobacco Use   Smoking status: Never   Smokeless tobacco: Never  Vaping Use   Vaping Use: Never used  Substance and Sexual Activity   Alcohol use: Not Currently   Drug use: No   Sexual activity: Defer  Partners: Male   Family History:  Family History  Problem Relation Age of Onset   Allergies Mother   Colon polyps Mother   Diabetes Father   Coronary Artery Disease (Blocked arteries around heart) Father 76   Kidney disease Father   High blood pressure (Hypertension) Father   Colon cancer Paternal Aunt   Review of Systems:   A 10+ ROS was performed, reviewed, and the pertinent orthopaedic findings are documented in the HPI.   Physical Examination:   BP 110/70 (BP Location: Left upper arm, Patient Position: Sitting, BP Cuff Size: Adult)   Ht 162.6 cm (_0 )   Wt 62.2 kg (137 lb 3.2 oz)   BMI 23.55 kg/m   Patient is a well-developed, well-nourished female in no acute distress. Patient has normal mood and affect. Patient is alert and oriented to person, place, and time.   HEENT: Atraumatic, normocephalic. Pupils equal and reactive to light. Extraocular motion intact. Noninjected sclera.  Cardiovascular: Regular rate and rhythm, with no murmurs, rubs, or gallops. Distal pulses  palpable.  Respiratory: Lungs clear to auscultation bilaterally.   Right Knee: Soft tissue swelling: minimal Effusion: none Erythema: none Crepitance: mild Tenderness: medial Alignment: relative varus Mediolateral laxity: medial pseudolaxity Posterior sag: negative Patellar tracking: Good tracking without evidence of subluxation or tilt Atrophy: No significant atrophy.  Quadriceps tone was fair to good. Range of motion: 0/0/135 degrees  Sensation intact over the saphenous, lateral sural cutaneous, superficial fibular, and deep fibular nerve distributions.  Tests Performed/Reviewed:  X-rays  3 views of the right knee were obtained. Images reveal severe loss of medial compartment joint space with significant osteophyte formation and subchondral sclerosis. No fractures or dislocations. No osseous abnormality noted.  I personally ordered and interpreted today's x-rays  Impression:   ICD-10-CM  1. Primary osteoarthritis of right knee M17.11   Plan:   The patient has end-stage degenerative changes of the right knee. It was explained to the patient that the condition is progressive in nature. Having failed conservative treatment, the patient has elected to proceed with a total joint arthroplasty. The patient will undergo a  total joint arthroplasty with Dr. Marry Guan. The risks of surgery, including blood clot and infection, were discussed with the patient. Measures to reduce these risks, including the use of anticoagulation, perioperative antibiotics, and early ambulation were discussed. The importance of postoperative physical therapy was discussed with the patient. The patient elects to proceed with surgery. The patient is instructed to stop all blood thinners prior to surgery. The patient is instructed to call the hospital the day before surgery to learn of the proper arrival time.   Contact our office with any questions or concerns. Follow up as indicated, or sooner should any new  problems arise, if conditions worsen, or if they are otherwise concerned.   Gwenlyn Fudge, [facility] [place], [place] [address] Phone: [phone]  This note was generated in part with voice recognition software and I apologize for any typographical errors that were not detected and corrected.  Electronically signed by Gwenlyn Fudge, PA at 09/10/2021 7:51 PM EST

## 2021-09-16 ENCOUNTER — Observation Stay: Payer: PPO

## 2021-09-16 ENCOUNTER — Ambulatory Visit: Payer: PPO | Admitting: Urgent Care

## 2021-09-16 ENCOUNTER — Other Ambulatory Visit: Payer: Self-pay

## 2021-09-16 ENCOUNTER — Encounter
Admission: RE | Disposition: A | Discharge status: Home / Self Care | Payer: Self-pay | Source: Home / Self Care | Attending: Orthopedic Surgery

## 2021-09-16 ENCOUNTER — Ambulatory Visit: Payer: PPO | Admitting: Anesthesiology

## 2021-09-16 ENCOUNTER — Observation Stay
Admission: RE | Admit: 2021-09-16 | Discharge: 2021-09-17 | Disposition: A | Discharge status: Home / Self Care | Payer: PPO | Attending: Orthopedic Surgery | Admitting: Orthopedic Surgery

## 2021-09-16 ENCOUNTER — Encounter: Payer: Self-pay | Admitting: Orthopedic Surgery

## 2021-09-16 DIAGNOSIS — Z471 Aftercare following joint replacement surgery: Secondary | ICD-10-CM | POA: Diagnosis not present

## 2021-09-16 DIAGNOSIS — M059 Rheumatoid arthritis with rheumatoid factor, unspecified: Secondary | ICD-10-CM

## 2021-09-16 DIAGNOSIS — Z96651 Presence of right artificial knee joint: Secondary | ICD-10-CM | POA: Diagnosis not present

## 2021-09-16 DIAGNOSIS — K219 Gastro-esophageal reflux disease without esophagitis: Secondary | ICD-10-CM

## 2021-09-16 DIAGNOSIS — Z79899 Other long term (current) drug therapy: Secondary | ICD-10-CM | POA: Insufficient documentation

## 2021-09-16 DIAGNOSIS — Z7982 Long term (current) use of aspirin: Secondary | ICD-10-CM | POA: Diagnosis not present

## 2021-09-16 DIAGNOSIS — M1711 Unilateral primary osteoarthritis, right knee: Principal | ICD-10-CM | POA: Insufficient documentation

## 2021-09-16 DIAGNOSIS — I10 Essential (primary) hypertension: Secondary | ICD-10-CM | POA: Diagnosis not present

## 2021-09-16 DIAGNOSIS — Z96659 Presence of unspecified artificial knee joint: Secondary | ICD-10-CM

## 2021-09-16 HISTORY — PX: KNEE ARTHROPLASTY: SHX992

## 2021-09-16 SURGERY — ARTHROPLASTY, KNEE, TOTAL, USING IMAGELESS COMPUTER-ASSISTED NAVIGATION
Anesthesia: General | Site: Knee | Laterality: Right

## 2021-09-16 MED ORDER — CELECOXIB 200 MG PO CAPS
200.0000 mg | ORAL_CAPSULE | Freq: Two times a day (BID) | ORAL | Status: DC
  Administered 2021-09-17: 200 mg via ORAL

## 2021-09-16 MED ORDER — CHLORHEXIDINE GLUCONATE 0.12 % MT SOLN: 15.0000 mL | Freq: Once | OROMUCOSAL | Status: AC

## 2021-09-16 MED ORDER — ACETAMINOPHEN 10 MG/ML IV SOLN
INTRAVENOUS | Status: DC | PRN
  Administered 2021-09-16: 1000 mg via INTRAVENOUS

## 2021-09-16 MED ORDER — METHOTREXATE 2.5 MG PO TABS: 20.0000 mg | ORAL_TABLET | ORAL | Status: DC

## 2021-09-16 MED ORDER — CEFAZOLIN SODIUM-DEXTROSE 2-4 GM/100ML-% IV SOLN
2.0000 g | INTRAVENOUS | Status: AC
  Administered 2021-09-16: 2 g via INTRAVENOUS

## 2021-09-16 MED ORDER — FENTANYL CITRATE (PF) 100 MCG/2ML IJ SOLN
INTRAMUSCULAR | Status: AC
  Filled 2021-09-16: qty 2

## 2021-09-16 MED ORDER — ONDANSETRON HCL 4 MG/2ML IJ SOLN
INTRAMUSCULAR | Status: AC
  Filled 2021-09-16: qty 2

## 2021-09-16 MED ORDER — LISINOPRIL-HYDROCHLOROTHIAZIDE 10-12.5 MG PO TABS: 1.0000 | ORAL_TABLET | Freq: Every day | ORAL | Status: DC

## 2021-09-16 MED ORDER — BUPIVACAINE HCL (PF) 0.25 % IJ SOLN
INTRAMUSCULAR | Status: DC | PRN
  Administered 2021-09-16: 60 mL

## 2021-09-16 MED ORDER — FENTANYL CITRATE (PF) 100 MCG/2ML IJ SOLN: 25.0000 ug | INTRAMUSCULAR | Status: DC | PRN

## 2021-09-16 MED ORDER — ACETAMINOPHEN 10 MG/ML IV SOLN
INTRAVENOUS | Status: AC
  Filled 2021-09-16: qty 100

## 2021-09-16 MED ORDER — TRANEXAMIC ACID-NACL 1000-0.7 MG/100ML-% IV SOLN: 1000.0000 mg | Freq: Once | INTRAVENOUS | Status: AC

## 2021-09-16 MED ORDER — TRANEXAMIC ACID-NACL 1000-0.7 MG/100ML-% IV SOLN
INTRAVENOUS | Status: AC
  Administered 2021-09-16: 1000 mg via INTRAVENOUS
  Filled 2021-09-16: qty 100

## 2021-09-16 MED ORDER — ENOXAPARIN SODIUM 30 MG/0.3ML IJ SOSY
30.0000 mg | PREFILLED_SYRINGE | Freq: Two times a day (BID) | INTRAMUSCULAR | Status: DC
  Administered 2021-09-17: 30 mg via SUBCUTANEOUS
  Filled 2021-09-16 (×2): qty 0.3

## 2021-09-16 MED ORDER — NEOMYCIN-POLYMYXIN B GU 40-200000 IR SOLN
Status: DC | PRN
  Administered 2021-09-16: 14 mL

## 2021-09-16 MED ORDER — SALINE SPRAY 0.65 % NA SOLN
1.0000 | NASAL | Status: DC | PRN
  Filled 2021-09-16: qty 44

## 2021-09-16 MED ORDER — XLEAR SINUS CARE SPRAY NA SOLN: 1.0000 | Freq: Every day | NASAL | Status: DC | PRN

## 2021-09-16 MED ORDER — HYDROCHLOROTHIAZIDE 12.5 MG PO TABS
12.5000 mg | ORAL_TABLET | Freq: Every day | ORAL | Status: DC
  Administered 2021-09-17: 12.5 mg via ORAL
  Filled 2021-09-16: qty 1

## 2021-09-16 MED ORDER — FENTANYL CITRATE (PF) 100 MCG/2ML IJ SOLN
INTRAMUSCULAR | Status: DC | PRN
  Administered 2021-09-16 (×2): 25 ug via INTRAVENOUS
  Administered 2021-09-16: 50 ug via INTRAVENOUS

## 2021-09-16 MED ORDER — MIDAZOLAM HCL 5 MG/5ML IJ SOLN
INTRAMUSCULAR | Status: DC | PRN
  Administered 2021-09-16: .5 mg via INTRAVENOUS
  Administered 2021-09-16: 1 mg via INTRAVENOUS
  Administered 2021-09-16: .5 mg via INTRAVENOUS

## 2021-09-16 MED ORDER — CELECOXIB 200 MG PO CAPS
ORAL_CAPSULE | ORAL | Status: AC
  Administered 2021-09-16: 200 mg via ORAL
  Filled 2021-09-16: qty 1

## 2021-09-16 MED ORDER — TRAMADOL HCL 50 MG PO TABS: 50.0000 mg | ORAL_TABLET | ORAL | Status: DC | PRN

## 2021-09-16 MED ORDER — OXYCODONE HCL 5 MG/5ML PO SOLN: 5.0000 mg | Freq: Once | ORAL | Status: DC | PRN

## 2021-09-16 MED ORDER — VITAMIN D3 25 MCG (1000 UNIT) PO TABS
1000.0000 [IU] | ORAL_TABLET | Freq: Every day | ORAL | Status: DC
  Administered 2021-09-17: 1000 [IU] via ORAL
  Filled 2021-09-16: qty 1

## 2021-09-16 MED ORDER — ACETAMINOPHEN 10 MG/ML IV SOLN
INTRAVENOUS | Status: AC
  Administered 2021-09-16: 1000 mg via INTRAVENOUS
  Filled 2021-09-16: qty 100

## 2021-09-16 MED ORDER — OXYCODONE HCL 5 MG PO TABS: 5.0000 mg | ORAL_TABLET | Freq: Once | ORAL | Status: DC | PRN

## 2021-09-16 MED ORDER — MAGNESIUM HYDROXIDE 400 MG/5ML PO SUSP
ORAL | Status: AC
  Administered 2021-09-16: 30 mL via ORAL
  Filled 2021-09-16: qty 30

## 2021-09-16 MED ORDER — TRANEXAMIC ACID-NACL 1000-0.7 MG/100ML-% IV SOLN
INTRAVENOUS | Status: AC
  Filled 2021-09-16: qty 100

## 2021-09-16 MED ORDER — CELECOXIB 200 MG PO CAPS
ORAL_CAPSULE | ORAL | Status: AC
  Administered 2021-09-16: 400 mg via ORAL
  Filled 2021-09-16: qty 2

## 2021-09-16 MED ORDER — DEXAMETHASONE SODIUM PHOSPHATE 10 MG/ML IJ SOLN: 8.0000 mg | Freq: Once | INTRAMUSCULAR | Status: AC

## 2021-09-16 MED ORDER — LISINOPRIL 10 MG PO TABS
10.0000 mg | ORAL_TABLET | Freq: Every day | ORAL | Status: DC
  Administered 2021-09-17: 10 mg via ORAL
  Filled 2021-09-16: qty 1

## 2021-09-16 MED ORDER — CEFAZOLIN SODIUM-DEXTROSE 2-4 GM/100ML-% IV SOLN
INTRAVENOUS | Status: AC
  Administered 2021-09-17: 2 g via INTRAVENOUS
  Filled 2021-09-16: qty 100

## 2021-09-16 MED ORDER — GABAPENTIN 300 MG PO CAPS
ORAL_CAPSULE | ORAL | Status: AC
  Administered 2021-09-16: 300 mg via ORAL
  Filled 2021-09-16: qty 1

## 2021-09-16 MED ORDER — 0.9 % SODIUM CHLORIDE (POUR BTL) OPTIME
TOPICAL | Status: DC | PRN
  Administered 2021-09-16: 500 mL

## 2021-09-16 MED ORDER — ORAL CARE MOUTH RINSE: 15.0000 mL | Freq: Once | OROMUCOSAL | Status: AC

## 2021-09-16 MED ORDER — OXYCODONE HCL 5 MG PO TABS: 10.0000 mg | ORAL_TABLET | ORAL | Status: DC | PRN

## 2021-09-16 MED ORDER — SENNOSIDES-DOCUSATE SODIUM 8.6-50 MG PO TABS
1.0000 | ORAL_TABLET | Freq: Two times a day (BID) | ORAL | Status: DC
  Administered 2021-09-16 – 2021-09-17 (×2): 1 via ORAL
  Filled 2021-09-16 (×3): qty 1

## 2021-09-16 MED ORDER — MAGNESIUM HYDROXIDE 400 MG/5ML PO SUSP
30.0000 mL | Freq: Every day | ORAL | Status: DC
  Administered 2021-09-17: 30 mL via ORAL

## 2021-09-16 MED ORDER — SODIUM CHLORIDE 0.9 % IR SOLN
Status: DC | PRN
  Administered 2021-09-16: 3000 mL

## 2021-09-16 MED ORDER — MIDAZOLAM HCL 2 MG/2ML IJ SOLN
INTRAMUSCULAR | Status: AC
  Filled 2021-09-16: qty 2

## 2021-09-16 MED ORDER — MENTHOL 3 MG MT LOZG
1.0000 | LOZENGE | OROMUCOSAL | Status: DC | PRN
  Filled 2021-09-16: qty 9

## 2021-09-16 MED ORDER — ACETAMINOPHEN 10 MG/ML IV SOLN: 1000.0000 mg | Freq: Four times a day (QID) | INTRAVENOUS | Status: DC

## 2021-09-16 MED ORDER — FLEET ENEMA 7-19 GM/118ML RE ENEM: 1.0000 | ENEMA | Freq: Once | RECTAL | Status: DC | PRN

## 2021-09-16 MED ORDER — PROBIOTIC DAILY PO CAPS: 1.0000 | ORAL_CAPSULE | Freq: Every day | ORAL | Status: DC

## 2021-09-16 MED ORDER — BUPIVACAINE HCL (PF) 0.5 % IJ SOLN
INTRAMUSCULAR | Status: DC | PRN
  Administered 2021-09-16: 3 mL via INTRATHECAL

## 2021-09-16 MED ORDER — PRONTOSAN WOUND IRRIGATION OPTIME
TOPICAL | Status: DC | PRN
  Administered 2021-09-16: 1 via TOPICAL

## 2021-09-16 MED ORDER — DIPHENHYDRAMINE HCL 12.5 MG/5ML PO ELIX
12.5000 mg | ORAL_SOLUTION | ORAL | Status: DC | PRN
  Filled 2021-09-16: qty 10

## 2021-09-16 MED ORDER — CEFAZOLIN SODIUM-DEXTROSE 2-4 GM/100ML-% IV SOLN
INTRAVENOUS | Status: AC
  Administered 2021-09-16: 2 g via INTRAVENOUS
  Filled 2021-09-16: qty 100

## 2021-09-16 MED ORDER — ADULT MULTIVITAMIN W/MINERALS CH
2.0000 | ORAL_TABLET | Freq: Every day | ORAL | Status: DC
  Administered 2021-09-17: 2 via ORAL
  Filled 2021-09-16: qty 2

## 2021-09-16 MED ORDER — ENSURE PRE-SURGERY PO LIQD
296.0000 mL | Freq: Once | ORAL | Status: DC
  Filled 2021-09-16: qty 296

## 2021-09-16 MED ORDER — ONDANSETRON HCL 4 MG/2ML IJ SOLN: 4.0000 mg | Freq: Four times a day (QID) | INTRAMUSCULAR | Status: DC | PRN

## 2021-09-16 MED ORDER — LACTATED RINGERS IV SOLN: INTRAVENOUS | Status: DC

## 2021-09-16 MED ORDER — PROPOFOL 500 MG/50ML IV EMUL
INTRAVENOUS | Status: DC | PRN
  Administered 2021-09-16: 80 ug/kg/min via INTRAVENOUS

## 2021-09-16 MED ORDER — SODIUM CHLORIDE 0.9 % IV SOLN
INTRAVENOUS | Status: DC | PRN
  Administered 2021-09-16: 60 mL

## 2021-09-16 MED ORDER — FOLIC ACID 1 MG PO TABS
1500.0000 ug | ORAL_TABLET | Freq: Every day | ORAL | Status: DC
  Administered 2021-09-17: 1.5 mg via ORAL
  Filled 2021-09-16: qty 1.5

## 2021-09-16 MED ORDER — OXYCODONE HCL 5 MG PO TABS
5.0000 mg | ORAL_TABLET | ORAL | Status: DC | PRN
  Administered 2021-09-17: 5 mg via ORAL

## 2021-09-16 MED ORDER — BISACODYL 10 MG RE SUPP
10.0000 mg | Freq: Every day | RECTAL | Status: DC | PRN
  Filled 2021-09-16: qty 1

## 2021-09-16 MED ORDER — ACETAMINOPHEN 10 MG/ML IV SOLN: 1000.0000 mg | Freq: Once | INTRAVENOUS | Status: DC | PRN

## 2021-09-16 MED ORDER — KRILL OIL 350 MG PO CAPS: 350.0000 mg | ORAL_CAPSULE | Freq: Every day | ORAL | Status: DC

## 2021-09-16 MED ORDER — CELECOXIB 200 MG PO CAPS: 400.0000 mg | ORAL_CAPSULE | Freq: Once | ORAL | Status: AC

## 2021-09-16 MED ORDER — FERROUS SULFATE 325 (65 FE) MG PO TABS
325.0000 mg | ORAL_TABLET | Freq: Two times a day (BID) | ORAL | Status: DC
  Administered 2021-09-16 – 2021-09-17 (×2): 325 mg via ORAL
  Filled 2021-09-16 (×3): qty 1

## 2021-09-16 MED ORDER — ONDANSETRON HCL 4 MG/2ML IJ SOLN: 4.0000 mg | Freq: Once | INTRAMUSCULAR | Status: DC | PRN

## 2021-09-16 MED ORDER — METOCLOPRAMIDE HCL 10 MG PO TABS
ORAL_TABLET | ORAL | Status: AC
  Administered 2021-09-16: 10 mg via ORAL
  Filled 2021-09-16: qty 1

## 2021-09-16 MED ORDER — PROPOFOL 1000 MG/100ML IV EMUL
INTRAVENOUS | Status: AC
  Filled 2021-09-16: qty 100

## 2021-09-16 MED ORDER — SODIUM CHLORIDE 0.9 % IV SOLN: INTRAVENOUS | Status: DC

## 2021-09-16 MED ORDER — CEFAZOLIN SODIUM-DEXTROSE 2-4 GM/100ML-% IV SOLN
INTRAVENOUS | Status: AC
  Filled 2021-09-16: qty 100

## 2021-09-16 MED ORDER — ONDANSETRON HCL 4 MG/2ML IJ SOLN
INTRAMUSCULAR | Status: DC | PRN
Start: 2021-09-16 — End: 2021-09-16
  Administered 2021-09-16: 4 mg via INTRAVENOUS

## 2021-09-16 MED ORDER — ALUM & MAG HYDROXIDE-SIMETH 200-200-20 MG/5ML PO SUSP: 30.0000 mL | ORAL | Status: DC | PRN

## 2021-09-16 MED ORDER — PHENYLEPHRINE HCL-NACL 20-0.9 MG/250ML-% IV SOLN
INTRAVENOUS | Status: DC | PRN
  Administered 2021-09-16: 29.333 ug/min via INTRAVENOUS

## 2021-09-16 MED ORDER — DEXAMETHASONE SODIUM PHOSPHATE 10 MG/ML IJ SOLN
INTRAMUSCULAR | Status: AC
  Administered 2021-09-16: 8 mg via INTRAVENOUS
  Filled 2021-09-16: qty 1

## 2021-09-16 MED ORDER — ACETAMINOPHEN 325 MG PO TABS: 325.0000 mg | ORAL_TABLET | Freq: Four times a day (QID) | ORAL | Status: DC | PRN

## 2021-09-16 MED ORDER — PANTOPRAZOLE SODIUM 40 MG PO TBEC
40.0000 mg | DELAYED_RELEASE_TABLET | Freq: Two times a day (BID) | ORAL | Status: DC
  Administered 2021-09-16 – 2021-09-17 (×2): 40 mg via ORAL
  Filled 2021-09-16 (×3): qty 1

## 2021-09-16 MED ORDER — SIMVASTATIN 20 MG PO TABS
20.0000 mg | ORAL_TABLET | Freq: Every evening | ORAL | Status: DC
  Administered 2021-09-16: 20 mg via ORAL
  Filled 2021-09-16 (×2): qty 1

## 2021-09-16 MED ORDER — LIDOCAINE HCL (PF) 2 % IJ SOLN
INTRAMUSCULAR | Status: AC
  Filled 2021-09-16: qty 5

## 2021-09-16 MED ORDER — HYDROMORPHONE HCL 1 MG/ML IJ SOLN: 0.5000 mg | INTRAMUSCULAR | Status: DC | PRN

## 2021-09-16 MED ORDER — PROPOFOL 10 MG/ML IV BOLUS
INTRAVENOUS | Status: AC
  Filled 2021-09-16: qty 20

## 2021-09-16 MED ORDER — ONDANSETRON HCL 4 MG PO TABS: 4.0000 mg | ORAL_TABLET | Freq: Four times a day (QID) | ORAL | Status: DC | PRN

## 2021-09-16 MED ORDER — RISAQUAD PO CAPS
1.0000 | ORAL_CAPSULE | Freq: Every day | ORAL | Status: DC
  Administered 2021-09-17: 1 via ORAL
  Filled 2021-09-16: qty 1

## 2021-09-16 MED ORDER — PHENOL 1.4 % MT LIQD
1.0000 | OROMUCOSAL | Status: DC | PRN
  Filled 2021-09-16: qty 177

## 2021-09-16 MED ORDER — GABAPENTIN 300 MG PO CAPS: 300.0000 mg | ORAL_CAPSULE | Freq: Once | ORAL | Status: AC

## 2021-09-16 MED ORDER — CEFAZOLIN SODIUM-DEXTROSE 2-4 GM/100ML-% IV SOLN: 2.0000 g | Freq: Four times a day (QID) | INTRAVENOUS | Status: AC

## 2021-09-16 MED ORDER — METOCLOPRAMIDE HCL 10 MG PO TABS
10.0000 mg | ORAL_TABLET | Freq: Three times a day (TID) | ORAL | Status: DC
  Administered 2021-09-17: 10 mg via ORAL

## 2021-09-16 MED ORDER — CHLORHEXIDINE GLUCONATE 0.12 % MT SOLN
OROMUCOSAL | Status: AC
  Administered 2021-09-16: 15 mL via OROMUCOSAL
  Filled 2021-09-16: qty 15

## 2021-09-16 MED ORDER — VITAMIN B-12 1000 MCG PO TABS
3000.0000 ug | ORAL_TABLET | Freq: Every day | ORAL | Status: DC
  Administered 2021-09-17: 3000 ug via ORAL
  Filled 2021-09-16: qty 3

## 2021-09-16 MED ORDER — TRANEXAMIC ACID-NACL 1000-0.7 MG/100ML-% IV SOLN
1000.0000 mg | INTRAVENOUS | Status: AC
  Administered 2021-09-16: 1000 mg via INTRAVENOUS

## 2021-09-16 SURGICAL SUPPLY — 79 items
ATTUNE PS FEM RT SZ 3 CEM KNEE (Femur) ×1 IMPLANT
ATTUNE PSRP INSR SZ3 5 KNEE (Insert) ×1 IMPLANT
BASE TIBIAL ROT PLAT SZ 3 KNEE (Knees) IMPLANT
BATTERY INSTRU NAVIGATION (MISCELLANEOUS) ×8 IMPLANT
BLADE SAW 70X12.5 (BLADE) ×2 IMPLANT
BLADE SAW 90X13X1.19 OSCILLAT (BLADE) ×2 IMPLANT
BLADE SAW 90X25X1.19 OSCILLAT (BLADE) ×2 IMPLANT
BONE CEMENT GENTAMICIN (Cement) ×4 IMPLANT
CEMENT BONE GENTAMICIN 40 (Cement) IMPLANT
COOLER POLAR GLACIER W/PUMP (MISCELLANEOUS) ×2 IMPLANT
CUFF TOURN SGL QUICK 24 (TOURNIQUET CUFF)
CUFF TOURN SGL QUICK 34 (TOURNIQUET CUFF)
CUFF TRNQT CYL 24X4X16.5-23 (TOURNIQUET CUFF) IMPLANT
CUFF TRNQT CYL 34X4.125X (TOURNIQUET CUFF) IMPLANT
DRAPE 3/4 80X56 (DRAPES) ×2 IMPLANT
DRAPE INCISE IOBAN 66X45 STRL (DRAPES) ×2 IMPLANT
DRSG DERMACEA 8X12 NADH (GAUZE/BANDAGES/DRESSINGS) ×2 IMPLANT
DRSG MEPILEX SACRM 8.7X9.8 (GAUZE/BANDAGES/DRESSINGS) ×2 IMPLANT
DRSG OPSITE POSTOP 4X12 (GAUZE/BANDAGES/DRESSINGS) ×1 IMPLANT
DRSG OPSITE POSTOP 4X14 (GAUZE/BANDAGES/DRESSINGS) ×2 IMPLANT
DRSG TEGADERM 4X4.75 (GAUZE/BANDAGES/DRESSINGS) ×2 IMPLANT
DURAPREP 26ML APPLICATOR (WOUND CARE) ×4 IMPLANT
ELECT CAUTERY BLADE 6.4 (BLADE) ×2 IMPLANT
ELECT REM PT RETURN 9FT ADLT (ELECTROSURGICAL) ×2
ELECTRODE REM PT RTRN 9FT ADLT (ELECTROSURGICAL) ×1 IMPLANT
EX-PIN ORTHOLOCK NAV 4X150 (PIN) ×4 IMPLANT
GLOVE SRG 8 PF TXTR STRL LF DI (GLOVE) ×1 IMPLANT
GLOVE SURG ENC TEXT LTX SZ7.5 (GLOVE) ×4 IMPLANT
GLOVE SURG UNDER POLY LF SZ7.5 (GLOVE) ×2 IMPLANT
GLOVE SURG UNDER POLY LF SZ8 (GLOVE) ×1
GOWN STRL REUS W/ TWL LRG LVL3 (GOWN DISPOSABLE) ×2 IMPLANT
GOWN STRL REUS W/ TWL XL LVL3 (GOWN DISPOSABLE) ×1 IMPLANT
GOWN STRL REUS W/TWL LRG LVL3 (GOWN DISPOSABLE) ×2
GOWN STRL REUS W/TWL XL LVL3 (GOWN DISPOSABLE) ×1
HEMOVAC 400CC 10FR (MISCELLANEOUS) ×2 IMPLANT
HOLDER FOLEY CATH W/STRAP (MISCELLANEOUS) ×2 IMPLANT
HOLSTER ELECTROSUGICAL PENCIL (MISCELLANEOUS) ×2 IMPLANT
IV NS IRRIG 3000ML ARTHROMATIC (IV SOLUTION) ×2 IMPLANT
KIT TURNOVER KIT A (KITS) ×2 IMPLANT
KNIFE SCULPS 14X20 (INSTRUMENTS) ×2 IMPLANT
LABEL OR SOLS (LABEL) ×2 IMPLANT
MANIFOLD NEPTUNE II (INSTRUMENTS) ×4 IMPLANT
NDL SAFETY ECLIPSE 18X1.5 (NEEDLE) ×1 IMPLANT
NDL SPNL 20GX3.5 QUINCKE YW (NEEDLE) ×2 IMPLANT
NEEDLE HYPO 18GX1.5 SHARP (NEEDLE) ×1
NEEDLE SPNL 20GX3.5 QUINCKE YW (NEEDLE) ×4 IMPLANT
NS IRRIG 500ML POUR BTL (IV SOLUTION) ×2 IMPLANT
PACK TOTAL KNEE (MISCELLANEOUS) ×2 IMPLANT
PAD ABD DERMACEA PRESS 5X9 (GAUZE/BANDAGES/DRESSINGS) ×4 IMPLANT
PAD WRAPON POLAR KNEE (MISCELLANEOUS) ×1 IMPLANT
PATELLA MEDIAL ATTUN 35MM KNEE (Knees) ×1 IMPLANT
PENCIL SMOKE EVACUATOR COATED (MISCELLANEOUS) ×2 IMPLANT
PIN DRILL FIX HALF THREAD (BIT) ×4 IMPLANT
PIN DRILL QUICK PACK ×3 IMPLANT
PIN FIXATION 1/8DIA X 3INL (PIN) ×2 IMPLANT
PULSAVAC PLUS IRRIG FAN TIP (DISPOSABLE) ×2
SOL PREP PVP 2OZ (MISCELLANEOUS) ×2
SOLUTION PREP PVP 2OZ (MISCELLANEOUS) ×1 IMPLANT
SOLUTION PRONTOSAN WOUND 350ML (IRRIGATION / IRRIGATOR) ×2 IMPLANT
SPONGE DRAIN TRACH 4X4 STRL 2S (GAUZE/BANDAGES/DRESSINGS) ×2 IMPLANT
SPONGE T-LAP 18X18 ~~LOC~~+RFID (SPONGE) ×6 IMPLANT
STAPLER SKIN PROX 35W (STAPLE) ×2 IMPLANT
STOCKINETTE IMPERV 14X48 (MISCELLANEOUS) ×1 IMPLANT
STRAP TIBIA SHORT (MISCELLANEOUS) ×2 IMPLANT
SUCTION FRAZIER HANDLE 10FR (MISCELLANEOUS) ×1
SUCTION TUBE FRAZIER 10FR DISP (MISCELLANEOUS) ×1 IMPLANT
SUT VIC AB 0 CT1 36 (SUTURE) ×4 IMPLANT
SUT VIC AB 1 CT1 36 (SUTURE) ×4 IMPLANT
SUT VIC AB 2-0 CT2 27 (SUTURE) ×2 IMPLANT
SYR 20ML LL LF (SYRINGE) ×2 IMPLANT
SYR 30ML LL (SYRINGE) ×4 IMPLANT
TIBIAL BASE ROT PLAT SZ 3 KNEE (Knees) ×2 IMPLANT
TIP FAN IRRIG PULSAVAC PLUS (DISPOSABLE) ×1 IMPLANT
TOWEL OR 17X26 4PK STRL BLUE (TOWEL DISPOSABLE) ×2 IMPLANT
TOWER CARTRIDGE SMART MIX (DISPOSABLE) ×2 IMPLANT
TRAY FOLEY MTR SLVR 16FR STAT (SET/KITS/TRAYS/PACK) ×2 IMPLANT
WATER STERILE IRR 1000ML POUR (IV SOLUTION) ×1 IMPLANT
WATER STERILE IRR 500ML POUR (IV SOLUTION) ×2 IMPLANT
WRAPON POLAR PAD KNEE (MISCELLANEOUS) ×2

## 2021-09-16 NOTE — TOC Progression Note (Signed)
Transition of Care [facility]) - Progression Note    Patient Details  Name: Deborah Singh MRN: [id number] Date of Birth: [birth year]  Transition of Care [facility]) CM/SW [place], RN Phone Number: 09/16/2021, 5:33 PM  Clinical Narrative:   met with the patient and her husband in the room  She is set up for [facility] with [facility] She has a RW at home and a raised toilet, she does not want a 3 in 1, She has transportation with her husband and can afford her medication, Reviewed the [place] letter, she stated understanding, no additional needs         Expected Discharge Plan and Services                                                 Social Determinants of Health (SDOH) Interventions    Readmission Risk Interventions No flowsheet data found.

## 2021-09-16 NOTE — Anesthesia Procedure Notes (Addendum)
Spinal  Patient location during procedure: OR Start time: 09/16/2021 11:35 AM End time: 09/16/2021 11:45 AM Reason for block: surgical anesthesia Preanesthetic Checklist Completed: patient identified, IV checked, site marked, risks and benefits discussed, surgical consent, monitors and equipment checked, pre-op evaluation and timeout performed Spinal Block Patient position: sitting Prep: Betadine Patient monitoring: heart rate, continuous pulse ox, blood pressure and cardiac monitor Approach: midline Location: L3-4 Injection technique: single-shot Needle Needle type: Introducer and Pencan  Needle gauge: 24 G Needle length: 10 cm Assessment Sensory level: T12 Events: paresthesia and CSF return Additional Notes Paresthesia on left, resolved, patient reported no paresthesia on injection. Negative blood return. Positive free-flowing CSF. Expiration date of kit checked and confirmed. Patient tolerated procedure well, without complications. L4-5 attempted x1 without success. L3-4 x1 attempt with success. Tight interspaces noted.

## 2021-09-16 NOTE — Care Management Obs Status (Signed)
[facility] NOTIFICATION   Patient Details  Name: Deborah Singh MRN: [id number] Date of Birth: 1951-01-31   Medicare Observation Status Notification Given:  Yes    Conception Oms, RN 09/16/2021, 5:33 PM

## 2021-09-16 NOTE — Transfer of Care (Signed)
Immediate Anesthesia Transfer of Care Note  Patient: Deborah Singh  Procedure(s) Performed: COMPUTER ASSISTED TOTAL KNEE ARTHROPLASTY (Right: Knee)  Patient Location: PACU  Anesthesia Type:Spinal  Level of Consciousness: awake, alert  and oriented  Airway & Oxygen Therapy: Patient Spontanous Breathing and Patient connected to nasal cannula oxygen  Post-op Assessment: Report given to RN and Post -op Vital signs reviewed and stable  Post vital signs: Reviewed and stable  Last Vitals:  Vitals Value Taken Time  BP 106/79 09/16/21 1454  Temp    Pulse 87 09/16/21 1455  Resp 16 09/16/21 1455  SpO2 100 % 09/16/21 1455  Vitals shown include unvalidated device data.  Last Pain:  Vitals:   09/16/21 1015  TempSrc: Oral  PainSc: 0-No pain         Complications: No notable events documented.

## 2021-09-16 NOTE — Evaluation (Signed)
Physical Therapy Evaluation Patient Details Name: Deborah Singh MRN: [id number] DOB: [birth year] Today's Date: 09/16/2021  History of Present Illness  Pt is a 71 y.o. female s/p R TKA secondary degenerative arthosis 09/16/21.  PMH includes Dupuytren's contracture of hand, htn, RA, L TKA 05/24/21, and anemia.  Clinical Impression  Prior to surgery, pt was independent with functional mobility; lives with her husband in 2 story home but plans to stay/sleep on main level (3-4 STE with R railing).  Currently pt is SBA semi-supine to sitting edge of bed; CGA with transfers using RW; and CGA to ambulate short distance bed to recliner with RW use.  Able to perform R LE SLR independently.  R knee AROM 0-75 degrees.  Pain R knee 0/10 at rest beginning of session and 1/10 at rest end of session (minimal pain throughout therapy session).  Did well with LE ex's in bed.  Pt would benefit from skilled PT to address noted impairments and functional limitations (see below for any additional details).  Upon hospital discharge, pt would benefit from [facility] [place] and support from family.    Recommendations for follow up therapy are one component of a multi-disciplinary discharge planning process, led by the attending physician.  Recommendations may be updated based on patient status, additional functional criteria and insurance authorization.  Follow Up Recommendations Home health PT    Assistance Recommended at Discharge Set up Supervision/Assistance  Patient can return home with the following  Assistance with cooking/housework;Assist for transportation;A little help with bathing/dressing/bathroom;Help with stairs or ramp for entrance    Equipment Recommendations Rolling walker (2 wheels) (pt has RW at home already)  Recommendations for Other Services  OT consult    Functional Status Assessment Patient has had a recent decline in their functional status and demonstrates the ability to make significant improvements in  function in a reasonable and predictable amount of time.     Precautions / Restrictions Precautions Precautions: Fall Required Braces or Orthoses: Knee Immobilizer - Right Knee Immobilizer - Right: Discontinue once straight leg raise with < 10 degree lag Restrictions Weight Bearing Restrictions: Yes RLE Weight Bearing: Weight bearing as tolerated      Mobility  Bed Mobility Overal bed mobility: Needs Assistance Bed Mobility: Supine to Sit     Supine to sit: Supervision, HOB elevated     General bed mobility comments: SBA for lines    Transfers Overall transfer level: Needs assistance Equipment used: Rolling walker (2 wheels) Transfers: Sit to/from Stand Sit to Stand: Min guard           General transfer comment: vc's for UE/LE placement for transfers; increased effort to stand noted    Ambulation/Gait Ambulation/Gait assistance: Min guard Gait Distance (Feet): 4 Feet (bed to recliner) Assistive device: Rolling walker (2 wheels)   Gait velocity: decreased     General Gait Details: antalgic; decreased stance time R LE; steady with RW use; initial vc's for technique  Stairs            Wheelchair Mobility    Modified Rankin (Stroke Patients Only)       Balance Overall balance assessment: Needs assistance Sitting-balance support: No upper extremity supported, Feet supported Sitting balance-Leahy Scale: Good Sitting balance - Comments: steady sitting reaching outside BOS   Standing balance support: Single extremity supported Standing balance-Leahy Scale: Fair Standing balance comment: steady standing with at least single UE support  Pertinent Vitals/Pain Pain Assessment Pain Assessment: 0-10 Pain Score: 1  Pain Location: R knee Pain Descriptors / Indicators: Sore Pain Intervention(s): Limited activity within patient's tolerance, Monitored during session, Premedicated before session, Repositioned, Other  (comment) (polar care applied) Vitals (HR and O2 on room air) stable and WFL throughout treatment session.    Home Living Family/patient expects to be discharged to:: Private residence Living Arrangements: Spouse/significant other Available Help at Discharge: Family;Available 24 hours/day Type of Home: House Home Access: Stairs to enter Entrance Stairs-Rails: Right Entrance Stairs-Number of Steps: 3-4 Alternate Level Stairs-Number of Steps: half bath on first floor Home Layout: Two level;Able to live on main level with bedroom/bathroom (twin bed on main level to sleep in) Home Equipment: Rolling Walker (2 wheels);Grab bars - tub/shower;Tub bench      Prior Function Prior Level of Function : Independent/Modified Independent                     Hand Dominance        Extremity/Trunk Assessment   Upper Extremity Assessment Upper Extremity Assessment: Overall WFL for tasks assessed    Lower Extremity Assessment Lower Extremity Assessment: RLE deficits/detail (L LE WFL (at least 3/5 AROM hip flexion, knee flexion/extension, and DF/PF)) RLE Deficits / Details: at least 3/5 hip flexion and ankle DF/PF AROM; good R isometric quad set; able to perform R LE SLR independently RLE: Unable to fully assess due to pain    Cervical / Trunk Assessment Cervical / Trunk Assessment: Normal  Communication   Communication: No difficulties  Cognition Arousal/Alertness: Awake/alert Behavior During Therapy: WFL for tasks assessed/performed Overall Cognitive Status: Within Functional Limits for tasks assessed                                          General Comments General comments (skin integrity, edema, etc.): Hemovac drain in place R LE.  Pt agreeable to PT session.    Exercises Total Joint Exercises Ankle Circles/Pumps: AROM, Strengthening, Both, 10 reps, Supine Quad Sets: AROM, Strengthening, Both, 10 reps, Supine Short Arc Quad: AROM, Strengthening, Right, 10  reps, Supine Heel Slides: AAROM, Strengthening, Right, 10 reps, Supine Hip ABduction/ADduction: AAROM, Strengthening, Right, 10 reps, Supine Straight Leg Raises: AROM, Strengthening, Right, 10 reps, Supine Goniometric ROM: R knee AROM 0-75 degrees   Assessment/Plan    PT Assessment Patient needs continued PT services  PT Problem List Decreased strength;Decreased range of motion;Decreased activity tolerance;Decreased balance;Decreased mobility;Decreased knowledge of use of DME;Decreased knowledge of precautions;Pain       PT Treatment Interventions DME instruction;Gait training;Stair training;Functional mobility training;Therapeutic activities;Therapeutic exercise;Balance training;Patient/family education    PT Goals (Current goals can be found in the Care Plan section)  Acute Rehab PT Goals Patient Stated Goal: to go home POD #1 PT Goal Formulation: With patient Time For Goal Achievement: 09/30/21 Potential to Achieve Goals: Good    Frequency BID     Co-evaluation               AM-PAC PT "6 Clicks" Mobility  Outcome Measure Help needed turning from your back to your side while in a flat bed without using bedrails?: None Help needed moving from lying on your back to sitting on the side of a flat bed without using bedrails?: A Little Help needed moving to and from a bed to a chair (including a wheelchair)?: A Little Help needed standing  up from a chair using your arms (e.g., wheelchair or bedside chair)?: A Little Help needed to walk in hospital room?: A Little Help needed climbing 3-5 steps with a railing? : A Little 6 Click Score: 19    End of Session Equipment Utilized During Treatment: Gait belt Activity Tolerance: Patient tolerated treatment well Patient left: in chair;with call bell/phone within reach;with family/visitor present;with SCD's reapplied;Other (comment) (B heels floating via towel rolls; polar care in place) Nurse Communication: Mobility  status;Precautions;Weight bearing status PT Visit Diagnosis: Other abnormalities of gait and mobility (R26.89);Muscle weakness (generalized) (M62.81);Pain Pain - Right/Left: Right Pain - part of body: Knee    Time: 2334-3568 PT Time Calculation (min) (ACUTE ONLY): 43 min   Charges:   PT Evaluation $PT Eval Low Complexity: 1 Low PT Treatments $Therapeutic Exercise: 8-22 mins $Therapeutic Activity: 8-22 mins       Leitha Bleak, PT 09/16/21, 5:26 PM

## 2021-09-16 NOTE — TOC CM/SW Note (Signed)
Confirmed with [place]-patient already set up with [facility] [place] for [facility] services. Will follow for possible equipment needs.

## 2021-09-16 NOTE — Anesthesia Procedure Notes (Addendum)
Spinal  Start time: 09/16/2021 11:40 AM End time: 09/16/2021 11:45 AM Reason for block: surgical anesthesia Staffing Performed: resident/CRNA  Anesthesiologist: Darrin Nipper, MD Resident/CRNA: Loletha Grayer, CRNA Preanesthetic Checklist Completed: patient identified, IV checked, site marked, risks and benefits discussed, surgical consent, monitors and equipment checked, pre-op evaluation and timeout performed Spinal Block Patient position: sitting Prep: ChloraPrep Patient monitoring: heart rate, continuous pulse ox and blood pressure Approach: midline Location: L3-4 Injection technique: single-shot Needle Needle type: Whitacre  Needle gauge: 25 G Assessment Sensory level: T3

## 2021-09-16 NOTE — Anesthesia Preprocedure Evaluation (Addendum)
Anesthesia Evaluation  Patient identified by MRN, date of birth, ID band Patient awake    Reviewed: Allergy & Precautions, NPO status , Patient's Chart, lab work & pertinent test results  History of Anesthesia Complications Negative for: history of anesthetic complications  Airway Mallampati: II   Neck ROM: Full    Dental no notable dental hx.    Pulmonary  Mild cough x2 weeks, no other symptoms   Pulmonary exam normal breath sounds clear to auscultation       Cardiovascular hypertension,  Rhythm:Regular Rate:Normal + Systolic murmurs    Neuro/Psych  Headaches,    GI/Hepatic GERD  ,  Endo/Other  negative endocrine ROS  Renal/GU negative Renal ROS     Musculoskeletal  (+) Arthritis , Rheumatoid disorders,    Abdominal   Peds  Hematology  (+) Blood dyscrasia, anemia ,   Anesthesia Other Findings   Reproductive/Obstetrics                           Anesthesia Physical Anesthesia Plan  ASA: 2  Anesthesia Plan: General and Spinal   Post-op Pain Management:    Induction: Intravenous  PONV Risk Score and Plan: 3 and Propofol infusion, TIVA, Treatment may vary due to age or medical condition and Ondansetron  Airway Management Planned: Natural Airway and Nasal Cannula  Additional Equipment:   Intra-op Plan:   Post-operative Plan:   Informed Consent: I have reviewed the patients History and Physical, chart, labs and discussed the procedure including the risks, benefits and alternatives for the proposed anesthesia with the patient or authorized representative who has indicated his/her understanding and acceptance.       Plan Discussed with: CRNA  Anesthesia Plan Comments: (Plan for spinal and GA with natural airway, LMA/GETA backup.  Patient consented for risks of anesthesia including but not limited to:  - adverse reactions to medications - damage to eyes, teeth, lips or other  oral mucosa - nerve damage due to positioning  - sore throat or hoarseness - headache, bleeding, infection, nerve damage 2/2 spinal - damage to heart, brain, nerves, lungs, other parts of body or loss of life  Informed patient about role of CRNA in peri- and intra-operative care.  Patient voiced understanding.)        Anesthesia Quick Evaluation

## 2021-09-16 NOTE — H&P (Signed)
The patient has been re-examined, and the chart reviewed, and there have been no interval changes to the documented history and physical.    The risks, benefits, and alternatives have been discussed at length. The patient expressed understanding of the risks benefits and agreed with plans for surgical intervention.  Loukisha Gunnerson P. Calton Harshfield, Jr. M.D.    

## 2021-09-16 NOTE — Op Note (Signed)
OPERATIVE NOTE  DATE OF SURGERY:  09/16/2021  PATIENT NAME:  Deborah Singh   DOB: April 14, 1951  MRN: [id number]  PRE-OPERATIVE DIAGNOSIS: Degenerative arthrosis of the right knee, primary  POST-OPERATIVE DIAGNOSIS:  Same  PROCEDURE:  Right total knee arthroplasty using computer-assisted navigation  SURGEON:  Marciano Sequin. M.D.  ASSISTANT: Cassell Smiles, PA-C (present and scrubbed throughout the case, critical for assistance with exposure, retraction, instrumentation, and closure)  ANESTHESIA: spinal  ESTIMATED BLOOD LOSS: 50 mL  FLUIDS REPLACED: 1100 mL of crystalloid  TOURNIQUET TIME: 80 minutes  DRAINS: 2 medium Hemovac drains  SOFT TISSUE RELEASES: Anterior cruciate ligament, posterior cruciate ligament, deep medial collateral ligament, patellofemoral ligament  IMPLANTS UTILIZED: DePuy Attune size 3 posterior stabilized femoral component (cemented), size 3 rotating platform tibial component (cemented), 35 mm medialized dome patella (cemented), and a 5 mm stabilized rotating platform polyethylene insert.  INDICATIONS FOR SURGERY: Deborah Singh is a 71 y.o. year old female with a long history of progressive knee pain. X-rays demonstrated severe degenerative changes in tricompartmental fashion. The patient had not seen any significant improvement despite conservative nonsurgical intervention. After discussion of the risks and benefits of surgical intervention, the patient expressed understanding of the risks benefits and agree with plans for total knee arthroplasty.   The risks, benefits, and alternatives were discussed at length including but not limited to the risks of infection, bleeding, nerve injury, stiffness, blood clots, the need for revision surgery, cardiopulmonary complications, among others, and they were willing to proceed.  PROCEDURE IN DETAIL: The patient was brought into the operating room and, after adequate spinal anesthesia was achieved, a tourniquet was  placed on the patient's upper thigh. The patient's knee and leg were cleaned and prepped with alcohol and DuraPrep and draped in the usual sterile fashion. A "timeout" was performed as per usual protocol. The lower extremity was exsanguinated using an Esmarch, and the tourniquet was inflated to 300 mmHg. An anterior longitudinal incision was made followed by a standard mid vastus approach. The deep fibers of the medial collateral ligament were elevated in a subperiosteal fashion off of the medial flare of the tibia so as to maintain a continuous soft tissue sleeve. The patella was subluxed laterally and the patellofemoral ligament was incised. Inspection of the knee demonstrated severe degenerative changes with full-thickness loss of articular cartilage. Osteophytes were debrided using a rongeur. Anterior and posterior cruciate ligaments were excised. Two 4.0 mm Schanz pins were inserted in the femur and into the tibia for attachment of the array of trackers used for computer-assisted navigation. Hip center was identified using a circumduction technique. Distal landmarks were mapped using the computer. The distal femur and proximal tibia were mapped using the computer. The distal femoral cutting guide was positioned using computer-assisted navigation so as to achieve a 5 distal valgus cut. The femur was sized and it was felt that a size 3 femoral component was appropriate. A size 3 femoral cutting guide was positioned and the anterior cut was performed and verified using the computer. This was followed by completion of the posterior and chamfer cuts. Femoral cutting guide for the central box was then positioned in the center box cut was performed.  Attention was then directed to the proximal tibia. Medial and lateral menisci were excised. The extramedullary tibial cutting guide was positioned using computer-assisted navigation so as to achieve a 0 varus-valgus alignment and 3 posterior slope. The cut was  performed and verified using the computer. The proximal tibia was  sized and it was felt that a size 3 tibial tray was appropriate. Tibial and femoral trials were inserted followed by insertion of a 5 mm polyethylene insert. This allowed for excellent mediolateral soft tissue balancing both in flexion and in full extension. Finally, the patella was cut and prepared so as to accommodate a 35 mm medialized dome patella. A patella trial was placed and the knee was placed through a range of motion with excellent patellar tracking appreciated. The femoral trial was removed after debridement of posterior osteophytes. The central post-hole for the tibial component was reamed followed by insertion of a keel punch. Tibial trials were then removed. Cut surfaces of bone were irrigated with copious amounts of normal saline using pulsatile lavage and then suctioned dry. Polymethylmethacrylate cement with gentamicin was prepared in the usual fashion using a vacuum mixer. Cement was applied to the cut surface of the proximal tibia as well as along the undersurface of a size 3 rotating platform tibial component. Tibial component was positioned and impacted into place. Excess cement was removed using [employer] fast streamer. Cement was then applied to the cut surfaces of the femur as well as along the posterior flanges of the size 3 femoral component. The femoral component was positioned and impacted into place. Excess cement was removed using [employer] fast streamer. A 5 mm polyethylene trial was inserted and the knee was brought into full extension with steady axial compression applied. Finally, cement was applied to the backside of a 35 mm medialized dome patella and the patellar component was positioned and patellar clamp applied. Excess cement was removed using [employer] fast streamer. After adequate curing of the cement, the tourniquet was deflated after a total tourniquet time of 80 minutes. Hemostasis was achieved using electrocautery. The knee was  irrigated with copious amounts of normal saline using pulsatile lavage followed by 350 ml of Prontosan and then suctioned dry. 20 mL of 1.3% Exparel and 60 mL of 0.25% Marcaine in 40 mL of normal saline was injected along the posterior capsule, medial and lateral gutters, and along the arthrotomy site. A 5 mm stabilized rotating platform polyethylene insert was inserted and the knee was placed through a range of motion with excellent mediolateral soft tissue balancing appreciated and excellent patellar tracking noted. 2 medium drains were placed in the wound bed and brought out through separate stab incisions. The medial parapatellar portion of the incision was reapproximated using interrupted sutures of #1 Vicryl. Subcutaneous tissue was approximated in layers using first #0 Vicryl followed #2-0 Vicryl. The skin was approximated with skin staples. A sterile dressing was applied.  The patient tolerated the procedure well and was transported to the recovery room in stable condition.    Raylynn Hersh P. Holley Bouche., M.D.

## 2021-09-17 ENCOUNTER — Encounter: Payer: Self-pay | Admitting: Orthopedic Surgery

## 2021-09-17 DIAGNOSIS — M1711 Unilateral primary osteoarthritis, right knee: Secondary | ICD-10-CM | POA: Diagnosis not present

## 2021-09-17 MED ORDER — CELECOXIB 200 MG PO CAPS: 200.0000 mg | ORAL_CAPSULE | Freq: Two times a day (BID) | ORAL | 0 refills | Status: DC

## 2021-09-17 MED ORDER — ACETAMINOPHEN 500 MG PO TABS
1000.0000 mg | ORAL_TABLET | Freq: Once | ORAL | Status: DC
Start: 2021-09-17 — End: 2021-09-17

## 2021-09-17 MED ORDER — ACETAMINOPHEN 325 MG PO TABS
ORAL_TABLET | ORAL | Status: AC
  Administered 2021-09-17: 650 mg via ORAL
  Filled 2021-09-17: qty 2

## 2021-09-17 MED ORDER — METOCLOPRAMIDE HCL 10 MG PO TABS
ORAL_TABLET | ORAL | Status: AC
  Administered 2021-09-17: 10 mg via ORAL
  Filled 2021-09-17: qty 1

## 2021-09-17 MED ORDER — OXYCODONE HCL 5 MG PO TABS
ORAL_TABLET | ORAL | Status: AC
  Filled 2021-09-17: qty 1

## 2021-09-17 MED ORDER — OXYCODONE HCL 5 MG PO TABS
ORAL_TABLET | ORAL | Status: AC
  Administered 2021-09-17: 5 mg via ORAL
  Filled 2021-09-17: qty 1

## 2021-09-17 MED ORDER — OXYCODONE HCL 5 MG PO TABS: 5.0000 mg | ORAL_TABLET | ORAL | 0 refills | Status: DC | PRN

## 2021-09-17 MED ORDER — ACETAMINOPHEN 10 MG/ML IV SOLN
INTRAVENOUS | Status: AC
  Administered 2021-09-17: 1000 mg via INTRAVENOUS
  Filled 2021-09-17: qty 100

## 2021-09-17 MED ORDER — ENOXAPARIN SODIUM 40 MG/0.4ML IJ SOSY
40.0000 mg | PREFILLED_SYRINGE | INTRAMUSCULAR | 0 refills | Status: DC
Start: 2021-09-17 — End: 2023-03-19

## 2021-09-17 MED ORDER — MAGNESIUM HYDROXIDE 400 MG/5ML PO SUSP
ORAL | Status: AC
  Filled 2021-09-17: qty 30

## 2021-09-17 MED ORDER — TRAMADOL HCL 50 MG PO TABS: 50.0000 mg | ORAL_TABLET | ORAL | 0 refills | Status: DC | PRN

## 2021-09-17 MED ORDER — METOCLOPRAMIDE HCL 10 MG PO TABS
ORAL_TABLET | ORAL | Status: AC
  Filled 2021-09-17: qty 1

## 2021-09-17 MED ORDER — CELECOXIB 200 MG PO CAPS
ORAL_CAPSULE | ORAL | Status: AC
  Filled 2021-09-17: qty 1

## 2021-09-17 NOTE — Progress Notes (Signed)
Physical Therapy Treatment Patient Details Name: Deborah Singh MRN: [id number] DOB: 04-15-1951 Today's Date: 09/17/2021   History of Present Illness Pt is a 71 y.o. female s/p R TKA secondary degenerative arthosis 09/16/21.  PMH includes Dupuytren's contracture of hand, htn, RA, L TKA 05/24/21, and anemia.    PT Comments    Pt resting in recliner upon PT arrival; agreeable to PT session; pt's husband present during sessions activities.  Supine to/from sitting modified independent; SBA with transfers using RW; SBA with ambulation 200 feet with RW use; and SBA navigating 4 steps with R railing.  Pt steady and safe with functional mobility during sessions activities.  Pain 2-3/10 R knee during session.  Did well tolerating LE ex's.  Reviewed home safety (including wearing appropriate footwear, threshold navigation, and turning on lights (and having 2nd assist for safety) at night for toileting.  Answered pt and pt's husbands questions: both verbalizing and appearing with appropriate understanding.  Pt appears safe to discharge home (from PT standpoint) when medically appropriate (pt's nurse and PA notified).    Recommendations for follow up therapy are one component of a multi-disciplinary discharge planning process, led by the attending physician.  Recommendations may be updated based on patient status, additional functional criteria and insurance authorization.  Follow Up Recommendations  Home health PT     Assistance Recommended at Discharge Set up Supervision/Assistance  Patient can return home with the following Assistance with cooking/housework;Assist for transportation;A little help with bathing/dressing/bathroom;Help with stairs or ramp for entrance   Equipment Recommendations  Rolling walker (2 wheels) (pt has RW at home already)    Recommendations for Other Services OT consult     Precautions / Restrictions Precautions Precautions: Fall Required Braces or Orthoses: Knee  Immobilizer - Right Knee Immobilizer - Right: Discontinue once straight leg raise with < 10 degree lag Restrictions Weight Bearing Restrictions: Yes RLE Weight Bearing: Weight bearing as tolerated     Mobility  Bed Mobility Overal bed mobility: Modified Independent Bed Mobility: Supine to Sit, Sit to Supine     Supine to sit: Modified independent (Device/Increase time) Sit to supine: Modified independent (Device/Increase time)   General bed mobility comments: bed flat; very mild increased effort to perform on own    Transfers Overall transfer level: Needs assistance Equipment used: Rolling walker (2 wheels) Transfers: Sit to/from Stand Sit to Stand: Supervision           General transfer comment: steady safe transfer from recliner using RW (x2 trials)    Ambulation/Gait Ambulation/Gait assistance: Supervision Gait Distance (Feet): 200 Feet Assistive device: Rolling walker (2 wheels)   Gait velocity: mildly decreased     General Gait Details: mild decreased stance time R LE; steady with RW use; step through gait pattern   Stairs Stairs: Yes Stairs assistance: Supervision Stair Management: One rail Right, Sideways Number of Stairs: 4 General stair comments: steady safe stairs navigation (no vc's required)   Wheelchair Mobility    Modified Rankin (Stroke Patients Only)       Balance Overall balance assessment: Needs assistance Sitting-balance support: No upper extremity supported, Feet supported Sitting balance-Leahy Scale: Normal Sitting balance - Comments: steady sitting reaching outside BOS   Standing balance support: No upper extremity supported Standing balance-Leahy Scale: Good Standing balance comment: steady standing reaching within BOS                            Cognition Arousal/Alertness: Awake/alert Behavior During  Therapy: WFL for tasks assessed/performed Overall Cognitive Status: Within Functional Limits for tasks  assessed                                          Exercises Total Joint Exercises Ankle Circles/Pumps: AROM, Strengthening, Both, 10 reps, Supine Quad Sets: AROM, Strengthening, Right, 10 reps, Supine Short Arc Quad: AROM, Strengthening, Right, 10 reps, Supine Heel Slides: AAROM, Strengthening, Right, 10 reps, Supine Hip ABduction/ADduction: AAROM, Strengthening, Right, 10 reps, Supine Straight Leg Raises: AROM, Strengthening, Right, 10 reps, Supine Goniometric ROM: R knee AROM 0-105 degrees (taken in AM session)    General Comments        Pertinent Vitals/Pain Pain Assessment Pain Assessment: 0-10 Pain Score: 3  Pain Location: R knee Pain Descriptors / Indicators: Sore, Aching Pain Intervention(s): Limited activity within patient's tolerance, Monitored during session, Premedicated before session, Repositioned, Other (comment) (polar care applied) Vitals (HR and O2 on room air) stable and WFL throughout treatment session.    Home Living      Prior Function            PT Goals (current goals can now be found in the care plan section) Acute Rehab PT Goals Patient Stated Goal: to go home POD #1 PT Goal Formulation: With patient Time For Goal Achievement: 09/30/21 Potential to Achieve Goals: Good Progress towards PT goals: Progressing toward goals    Frequency    BID      PT Plan Current plan remains appropriate    Co-evaluation              AM-PAC PT "6 Clicks" Mobility   Outcome Measure  Help needed turning from your back to your side while in a flat bed without using bedrails?: None Help needed moving from lying on your back to sitting on the side of a flat bed without using bedrails?: None Help needed moving to and from a bed to a chair (including a wheelchair)?: A Little Help needed standing up from a chair using your arms (e.g., wheelchair or bedside chair)?: A Little Help needed to walk in hospital room?: A Little Help needed  climbing 3-5 steps with a railing? : A Little 6 Click Score: 20    End of Session Equipment Utilized During Treatment: Gait belt Activity Tolerance: Patient tolerated treatment well Patient left: in chair;with call bell/phone within reach;with family/visitor present;Other (comment) (B heels floating via towel rolls; polar care in place) Nurse Communication: Mobility status;Precautions;Weight bearing status PT Visit Diagnosis: Other abnormalities of gait and mobility (R26.89);Muscle weakness (generalized) (M62.81);Pain Pain - Right/Left: Right Pain - part of body: Knee     Time: 0623-7628 PT Time Calculation (min) (ACUTE ONLY): 38 min  Charges:  $Gait Training: 8-22 mins $Therapeutic Exercise: 8-22 mins $Therapeutic Activity: 8-22 mins                    Leitha Bleak, PT 09/17/21, 2:07 PM

## 2021-09-17 NOTE — Progress Notes (Signed)
Patient did well overnight.  VSS and neurovascular check adequate.  Pain has been good with scheduled Tylenol, no PRN requested.  Dressing to incision C/D/I.  Polar care and bone foam in place.  Hemovac with moderate amount of bloody drainage.  Patient got up to the BR a couple times with the walker.  No significant changes.  Continue to monitor.

## 2021-09-17 NOTE — Discharge Summary (Signed)
Physician Discharge Summary  Patient ID: Deborah Singh MRN: [id number] DOB/AGE: [birth year] 71 y.o.  Admit date: 09/16/2021 Discharge date: 09/17/2021  Admission Diagnoses:  Total knee replacement status [Z96.659]  Surgeries:Procedure(s):  Right total knee arthroplasty using computer-assisted navigation   SURGEON:  Marciano Sequin. M.D.   ASSISTANT: Cassell Smiles, PA-C (present and scrubbed throughout the case, critical for assistance with exposure, retraction, instrumentation, and closure)   ANESTHESIA: spinal   ESTIMATED BLOOD LOSS: 50 mL   FLUIDS REPLACED: 1100 mL of crystalloid   TOURNIQUET TIME: 80 minutes   DRAINS: 2 medium Hemovac drains   SOFT TISSUE RELEASES: Anterior cruciate ligament, posterior cruciate ligament, deep medial collateral ligament, patellofemoral ligament   IMPLANTS UTILIZED: DePuy Attune size 3 posterior stabilized femoral component (cemented), size 3 rotating platform tibial component (cemented), 35 mm medialized dome patella (cemented), and a 5 mm stabilized rotating platform polyethylene insert.  Discharge Diagnoses: Patient Active Problem List   Diagnosis Date Noted   Total knee replacement status 09/16/2021   De Quervain's tenosynovitis 05/24/2021   Dupuytren's contracture of hand 05/24/2021   GERD (gastroesophageal reflux disease) 05/24/2021   Hyperlipidemia 05/24/2021   Hypertension 05/24/2021   Status post total left knee replacement 05/24/2021   Primary osteoarthritis of both knees 03/31/2021   Bursitis of left shoulder 10/01/2015   Rotator cuff tendinitis, left 10/01/2015   Rheumatoid arthritis with rheumatoid factor ([place]) 03/31/2014    Past Medical History:  Diagnosis Date   Actinic keratosis    Anemia    Colon adenomas    De Quervain's tenosynovitis    GERD (gastroesophageal reflux disease)    Headache    Hyperlipidemia    Hypertension    Rheumatoid arthritis ([place])      Transfusion:    Consultants (if any):    Discharged Condition: Improved  Hospital Course: Deborah Singh is an 71 y.o. female who was admitted 09/16/2021 with a diagnosis of right knee osteoarthritis and went to the operating room on 09/16/2021 and underwent right total knee arthroplasty. The patient received perioperative antibiotics for prophylaxis (see below). The patient tolerated the procedure well and was transported to PACU in stable condition. After meeting PACU criteria, the patient was subsequently transferred to the Orthopaedics/Rehabilitation unit.   The patient received DVT prophylaxis in the form of early mobilization, Lovenox, TED hose, and SCDs . A sacral pad had been placed and heels were elevated off of the bed with rolled towels in order to protect skin integrity. Foley catheter was discontinued on postoperative day #0. Wound drains were discontinued on postoperative day #1. The surgical incision was healing well without signs of infection.  Physical therapy was initiated postoperatively for transfers, gait training, and strengthening. Occupational therapy was initiated for activities of daily living and evaluation for assisted devices. Rehabilitation goals were reviewed in detail with the patient. The patient made steady progress with physical therapy and physical therapy recommended discharge to Home.   The patient achieved the preliminary goals of this hospitalization and was felt to be medically and orthopaedically appropriate for discharge.  She was given perioperative antibiotics:  Anti-infectives (From admission, onward)    Start     Dose/Rate Route Frequency Ordered Stop   09/16/21 1800  ceFAZolin (ANCEF) IVPB 2g/100 mL premix        2 g 200 mL/hr over 30 Minutes Intravenous Every 6 hours 09/16/21 1606 09/17/21 0043   09/16/21 1721  ceFAZolin (ANCEF) 2-4 GM/100ML-% IVPB       Note to  Pharmacy: Olena Mater F: cabinet override      09/16/21 1721 09/16/21 1859   09/16/21 1006  ceFAZolin (ANCEF) 2-4  GM/100ML-% IVPB       Note to Pharmacy: Sylvester Harder P: cabinet override      09/16/21 1006 09/16/21 2153   09/16/21 0600  ceFAZolin (ANCEF) IVPB 2g/100 mL premix        2 g 200 mL/hr over 30 Minutes Intravenous On call to O.R. 09/16/21 7741 09/16/21 1157     .  Recent vital signs:  Vitals:   09/17/21 0430 09/17/21 0741  BP: 137/79 (!) 151/86  Pulse: 80 75  Resp: 16 18  Temp: 98.1 F (36.7 C) 97.9 F (36.6 C)  SpO2: 96% 97%    Recent laboratory studies:  No results for input(s): WBC, HGB, HCT, PLT, K, CL, CO2, BUN, CREATININE, GLUCOSE, CALCIUM, LABPT, INR in the last 72 hours.  Diagnostic Studies: DG Knee Right Port  Result Date: 09/16/2021 CLINICAL DATA:  Total knee replacement status EXAM: PORTABLE RIGHT KNEE - 1-2 VIEW COMPARISON:  None. FINDINGS: Status post total right knee arthroplasty. There are overlying anterior knee surgical skin staples. Expected postoperative changes including mild joint effusion and postoperative soft tissue air. A drain overlies the distal anterior thigh with tip overlying the patellofemoral joint. No perihardware lucency is seen to indicate hardware failure or loosening. No acute fracture or dislocation. IMPRESSION: Status post total right knee arthroplasty without evidence of hardware failure. Electronically Signed   By: Yvonne Kendall M.D.   On: 09/16/2021 15:09    Discharge Medications:   Allergies as of 09/17/2021       Reactions   Penicillin V    IgE = 65 (WNL) on 05/14/2021        Medication List     STOP taking these medications    aspirin EC 81 MG tablet   Humira Pen 40 MG/0.8ML Pnkt Generic drug: Adalimumab       TAKE these medications    acetaminophen 500 MG tablet Commonly known as: TYLENOL Take 1-2 tablets (500-1,000 mg total) by mouth every 6 (six) hours as needed.   celecoxib 200 MG capsule Commonly known as: CELEBREX Take 1 capsule (200 mg total) by mouth 2 (two) times daily.   Cholecalciferol 25 MCG (1000 UT)  tablet Take 1,000 Units by mouth daily.   enoxaparin 40 MG/0.4ML injection Commonly known as: LOVENOX Inject 0.4 mLs (40 mg total) into the skin daily for 14 days.   folic acid 287 MCG tablet Commonly known as: FOLVITE Take 1,600 mcg by mouth daily.   Jublia 10 % Soln Generic drug: Efinaconazole Apply to affected toenails every night. What changed:  how much to take how to take this when to take this reasons to take this additional instructions   Krill Oil 350 MG Caps Take 350 mg by mouth daily.   lisinopril-hydrochlorothiazide 10-12.5 MG tablet Commonly known as: ZESTORETIC Take 1 tablet by mouth daily.   MELATONIN GUMMIES PO Take 10 mg by mouth at bedtime.   methotrexate 2.5 MG tablet Take 20 mg by mouth once a week.   multivitamin tablet Take 2 tablets by mouth daily.   omeprazole 20 MG capsule Commonly known as: PRILOSEC Take 20 mg by mouth daily.   oxyCODONE 5 MG immediate release tablet Commonly known as: Oxy IR/ROXICODONE Take 1 tablet (5 mg total) by mouth every 4 (four) hours as needed for severe pain.   PROBIOTIC DAILY PO Take 1 tablet by mouth  daily. 10 billion active cultures   REFRESH DRY EYE THERAPY OP Place 1 drop into both eyes daily as needed (Dry eye).   simvastatin 20 MG tablet Commonly known as: ZOCOR Take 20 mg by mouth daily.   traMADol 50 MG tablet Commonly known as: ULTRAM Take 1 tablet (50 mg total) by mouth every 4 (four) hours as needed for moderate pain.   VITAMIN B 12 PO Take 3,000 mcg by mouth daily.   Xlear Sinus Care Spray Soln Place 1 spray into the nose daily as needed (congestion).               Durable Medical Equipment  (From admission, onward)           Start     Ordered   09/16/21 1607  DME Walker rolling  Once       Question:  Patient needs a walker to treat with the following condition  Answer:  Total knee replacement status   09/16/21 1606   09/16/21 1607  DME Bedside commode  Once        Question:  Patient needs a bedside commode to treat with the following condition  Answer:  Total knee replacement status   09/16/21 1606            Disposition: Home with home health PT     Follow-up Information     Fausto Skillern, PA-C Follow up on 10/01/2021.   Specialty: Orthopedic Surgery Why: at 1:45pm Contact information: [place] [id number] [phone]         Dereck Leep, MD Follow up on 10/29/2021.   Specialty: Orthopedic Surgery Why: at 2:45pm Contact information: [place] [address] [place], PA-C 09/17/2021, 9:48 AM

## 2021-09-17 NOTE — Anesthesia Postprocedure Evaluation (Signed)
Anesthesia Post Note  Patient: Deborah Singh  Procedure(s) Performed: COMPUTER ASSISTED TOTAL KNEE ARTHROPLASTY (Right: Knee)  Patient location during evaluation: PACU Anesthesia Type: Spinal and General Level of consciousness: awake and alert, oriented and patient cooperative Pain management: pain level controlled Vital Signs Assessment: post-procedure vital signs reviewed and stable Respiratory status: spontaneous breathing, nonlabored ventilation and respiratory function stable Cardiovascular status: blood pressure returned to baseline and stable Postop Assessment: adequate PO intake Anesthetic complications: no   No notable events documented.   Last Vitals:  Vitals:   09/17/21 0000 09/17/21 0430  BP: 121/71 137/79  Pulse: 83 80  Resp: 16 16  Temp: 36.6 C 36.7 C  SpO2: 93% 96%    Last Pain:  Vitals:   09/17/21 0430  TempSrc: Temporal  PainSc: 2                  Darrin Nipper

## 2021-09-17 NOTE — Progress Notes (Signed)
°  Subjective: 1 Day Post-Op Procedure(s) (LRB): COMPUTER ASSISTED TOTAL KNEE ARTHROPLASTY (Right) Patient reports pain as well-controlled.   Patient is well, and has had no acute complaints or problems Plan is to go Home after hospital stay. Negative for chest pain and shortness of breath Fever: no Gastrointestinal: negative for nausea and vomiting.   Patient has had a bowel movement.  Objective: Vital signs in last 24 hours: Temp:  [97.1 F (36.2 C)-98.1 F (36.7 C)] 97.9 F (36.6 C) (01/17 0741) Pulse Rate:  [75-87] 75 (01/17 0741) Resp:  [12-18] 18 (01/17 0741) BP: (106-170)/(71-88) 151/86 (01/17 0741) SpO2:  [93 %-100 %] 97 % (01/17 0741) Weight:  [60.8 kg] 60.8 kg (01/16 1015)  Intake/Output from previous day:  Intake/Output Summary (Last 24 hours) at 09/17/2021 0908 Last data filed at 09/17/2021 0600 Gross per 24 hour  Intake 3243.9 ml  Output 865 ml  Net 2378.9 ml    Intake/Output this shift: No intake/output data recorded.  Labs: No results for input(s): HGB in the last 72 hours. No results for input(s): WBC, RBC, HCT, PLT in the last 72 hours. No results for input(s): NA, K, CL, CO2, BUN, CREATININE, GLUCOSE, CALCIUM in the last 72 hours. No results for input(s): LABPT, INR in the last 72 hours.   EXAM General - Patient is Alert, Appropriate, and Oriented Extremity - Neurovascular intact Dorsiflexion/Plantar flexion intact Compartment soft Dressing/Incision -Postoperative dressing remains in place., Polar Care in place and working. , Hemovac in place. , Following removal of post-op dressing, mild drainage noted on honeycomb  Motor Function - intact, moving foot and toes well on exam. Able to perform independent SLR.  Cardiovascular- Regular rate and rhythm, no murmurs/rubs/gallops Respiratory- Lungs clear to auscultation bilaterally Gastrointestinal- soft, nontender, and active bowel sounds   Assessment/Plan: 1 Day Post-Op Procedure(s) (LRB): COMPUTER  ASSISTED TOTAL KNEE ARTHROPLASTY (Right) Principal Problem:   Total knee replacement status  Estimated body mass index is 23 kg/m as calculated from the following:   Height as of this encounter: 5\' 4"  (1.626 m).   Weight as of this encounter: 60.8 kg. Advance diet Up with therapy  Likely d/c today pending completion of therapy goals.   Post-op dressing removed. , Hemovac removed., and Mini compression dressing applied.   DVT Prophylaxis - Lovenox, Ted hose, and SCDs Weight-Bearing as tolerated to right leg  Cassell Smiles, PA-C [facility] Orthopaedic Surgery 09/17/2021, 9:08 AM

## 2021-09-17 NOTE — Evaluation (Signed)
Occupational Therapy Evaluation Patient Details Name: MASSIE COGLIANO MRN: [id number] DOB: 07-31-1951 Today's Date: 09/17/2021   History of Present Illness Pt is a 71 y.o. female s/p R TKA secondary degenerative arthosis 09/16/21.  PMH includes Dupuytren's contracture of hand, htn, RA, L TKA 05/24/21, and anemia.   Clinical Impression   Upon entering the room, pt sitting in bed after finishing PT and RN present to give medications. Pt is agreeable to OT evaluation. She reports having L TKA last September and feels familiar with aspects of self care and mobility after surgical intervention. OT educated, demonstrated, and provided paper handout for polar care and self care techniques. Pt has all needed equipment from prior surgery. Pt currently performing functional mobility/transfers with supervision with use of RW. Pt and family member have no further concerns about discharge and safety at home. All needs within reach. OT to SIGN OFF at this time.      Recommendations for follow up therapy are one component of a multi-disciplinary discharge planning process, led by the attending physician.  Recommendations may be updated based on patient status, additional functional criteria and insurance authorization.   Follow Up Recommendations  No OT follow up    Assistance Recommended at Discharge Intermittent Supervision/Assistance  Patient can return home with the following A little help with walking and/or transfers;A little help with bathing/dressing/bathroom;Help with stairs or ramp for entrance    Functional Status Assessment  Patient has had a recent decline in their functional status and demonstrates the ability to make significant improvements in function in a reasonable and predictable amount of time.  Equipment Recommendations  None recommended by OT (Has all needed equipment)    Recommendations for Other Services       Precautions / Restrictions Precautions Precautions: Fall Required  Braces or Orthoses: Knee Immobilizer - Right Knee Immobilizer - Right: Discontinue once straight leg raise with < 10 degree lag Restrictions Weight Bearing Restrictions: Yes RLE Weight Bearing: Weight bearing as tolerated      Mobility Bed Mobility               General bed mobility comments: Pt seated in recliner chair    Transfers Overall transfer level: Needs assistance Equipment used: Rolling walker (2 wheels) Transfers: Sit to/from Stand Sit to Stand: Supervision                      ADL either performed or assessed with clinical judgement   ADL Overall ADL's : Needs assistance/impaired                                       General ADL Comments: set up A for grooming and UB self care. Min A for LB self care to don R shoe and sock. She does have [place] reacher and OT advised her to utilize at home to increase Ind in self care.     Vision Patient Visual Report: No change from baseline              Pertinent Vitals/Pain Pain Assessment Pain Assessment: 0-10 Pain Score: 2  Pain Location: R knee Pain Descriptors / Indicators: Sore, Aching Pain Intervention(s): Limited activity within patient's tolerance, Monitored during session, RN gave pain meds during session, Repositioned, Ice applied        Extremity/Trunk Assessment Upper Extremity Assessment Upper Extremity Assessment: Overall WFL for tasks assessed   Lower  Extremity Assessment Lower Extremity Assessment: Defer to PT evaluation          Cognition Arousal/Alertness: Awake/alert Behavior During Therapy: WFL for tasks assessed/performed Overall Cognitive Status: Within Functional Limits for tasks assessed                                                  Home Living Family/patient expects to be discharged to:: Private residence Living Arrangements: Spouse/significant other Available Help at Discharge: Family;Available 24 hours/day Type of Home:  House Home Access: Stairs to enter [employer] of Steps: 3-4 Entrance Stairs-Rails: Right Home Layout: Two level;Able to live on main level with bedroom/bathroom Alternate Level Stairs-Number of Steps: half bath on first floor   Bathroom Shower/Tub: Tub/shower unit   Bathroom Toilet: Handicapped height     Home Equipment: Conservation officer, nature (2 wheels);Grab bars - tub/shower;Tub bench          Prior Functioning/Environment Prior Level of Function : Independent/Modified Independent                        OT Problem List: Decreased strength;Decreased range of motion;Decreased activity tolerance;Impaired balance (sitting and/or standing);Decreased safety awareness;Pain;Decreased knowledge of precautions;Decreased knowledge of use of DME or AE         OT Goals(Current goals can be found in the care plan section) Acute Rehab OT Goals Patient Stated Goal: to go home OT Goal Formulation: With patient/family Time For Goal Achievement: 10/01/21 Potential to Achieve Goals: Good  OT Frequency:         AM-PAC OT "6 Clicks" Daily Activity     Outcome Measure Help from another person eating meals?: None Help from another person taking care of personal grooming?: A Little Help from another person toileting, which includes using toliet, bedpan, or urinal?: A Little Help from another person bathing (including washing, rinsing, drying)?: A Little Help from another person to put on and taking off regular upper body clothing?: None Help from another person to put on and taking off regular lower body clothing?: A Little 6 Click Score: 20   End of Session Nurse Communication: Mobility status  Activity Tolerance: Patient tolerated treatment well Patient left: in bed;with call bell/phone within reach;with family/visitor present  OT Visit Diagnosis: Unsteadiness on feet (R26.81);Muscle weakness (generalized) (M62.81);Pain Pain - Right/Left: Right Pain - part of body: Knee                 Time: 6213-0865 OT Time Calculation (min): 22 min Charges:  OT General Charges $OT Visit: 1 Visit OT Evaluation $OT Eval Low Complexity: 1 Low OT Treatments $Self Care/Home Management : 8-22 mins  Darleen Crocker, MS, OTR/L , CBIS ascom [phone]  09/17/21, 1:17 PM

## 2021-09-17 NOTE — Progress Notes (Signed)
Physical Therapy Treatment Patient Details Name: Deborah Singh MRN: [id number] DOB: [birth year] Today's Date: 09/17/2021   History of Present Illness Pt is a 71 y.o. female s/p R TKA secondary degenerative arthosis 09/16/21.  PMH includes Dupuytren's contracture of hand, htn, RA, L TKA 05/24/21, and anemia.    PT Comments    Pt just returning from bathroom (walking with NT) upon PT arrival.  1-2/10 R knee pain during session.  R knee AROM 0-105 degrees.  Did well with R LE ex's in sitting.  SBA with transfers using RW; SBA with ambulation 180 feet with RW use; and CGA to SBA navigating 8 steps with R railing.  Pt steady and safe during sessions activities.  Pt appears safe to discharge home with support of family when medically appropriate--PA-C notified.   Recommendations for follow up therapy are one component of a multi-disciplinary discharge planning process, led by the attending physician.  Recommendations may be updated based on patient status, additional functional criteria and insurance authorization.  Follow Up Recommendations  Home health PT     Assistance Recommended at Discharge Set up Supervision/Assistance  Patient can return home with the following Assistance with cooking/housework;Assist for transportation;A little help with bathing/dressing/bathroom;Help with stairs or ramp for entrance   Equipment Recommendations  Rolling walker (2 wheels) (pt has RW at home already)    Recommendations for Other Services OT consult     Precautions / Restrictions Precautions Precautions: Fall Required Braces or Orthoses: Knee Immobilizer - Right Knee Immobilizer - Right: Discontinue once straight leg raise with < 10 degree lag Restrictions Weight Bearing Restrictions: Yes RLE Weight Bearing: Weight bearing as tolerated     Mobility  Bed Mobility               General bed mobility comments: Deferred    Transfers Overall transfer level: Needs assistance Equipment  used: Rolling walker (2 wheels) Transfers: Sit to/from Stand Sit to Stand: Supervision           General transfer comment: steady safe transfer from recliner using RW    Ambulation/Gait Ambulation/Gait assistance: Supervision Gait Distance (Feet): 180 Feet Assistive device: Rolling walker (2 wheels)   Gait velocity: decreased     General Gait Details: mild decreased stance time R LE; steady with RW use; step through gait pattern   Stairs Stairs: Yes Stairs assistance: Min guard, Supervision Stair Management: One rail Right, Sideways Number of Stairs: 8 General stair comments: initial vc's for technique and then pt able to perform safely on own without any further cueing   Wheelchair Mobility    Modified Rankin (Stroke Patients Only)       Balance Overall balance assessment: Needs assistance Sitting-balance support: No upper extremity supported, Feet supported Sitting balance-Leahy Scale: Normal Sitting balance - Comments: steady sitting reaching outside BOS   Standing balance support: No upper extremity supported Standing balance-Leahy Scale: Good Standing balance comment: steady standing reaching within BOS                            Cognition Arousal/Alertness: Awake/alert Behavior During Therapy: WFL for tasks assessed/performed Overall Cognitive Status: Within Functional Limits for tasks assessed                                          Exercises Total Joint Exercises Long Arc Quad: AROM, Strengthening,  Right, 10 reps, Seated Knee Flexion: AROM, Strengthening, Right, 10 reps, Seated Goniometric ROM: R knee AROM 0-105 degrees    General Comments  Nursing cleared pt for participation in physical therapy.  Pt agreeable to PT session.       Pertinent Vitals/Pain Pain Assessment Pain Assessment: 0-10 Pain Score: 2  Pain Location: R knee Pain Descriptors / Indicators: Sore Pain Intervention(s): Limited activity within  patient's tolerance, Monitored during session, Premedicated before session, Repositioned, Other (comment) (nurse applying polar care end of session) Vitals (HR and O2 on room air) stable and WFL throughout treatment session.    Home Living                          Prior Function            PT Goals (current goals can now be found in the care plan section) Acute Rehab PT Goals Patient Stated Goal: to go home POD #1 PT Goal Formulation: With patient Time For Goal Achievement: 09/30/21 Potential to Achieve Goals: Good Progress towards PT goals: Progressing toward goals    Frequency    BID      PT Plan Current plan remains appropriate    Co-evaluation              AM-PAC PT "6 Clicks" Mobility   Outcome Measure  Help needed turning from your back to your side while in a flat bed without using bedrails?: None Help needed moving from lying on your back to sitting on the side of a flat bed without using bedrails?: None Help needed moving to and from a bed to a chair (including a wheelchair)?: A Little Help needed standing up from a chair using your arms (e.g., wheelchair or bedside chair)?: A Little Help needed to walk in hospital room?: A Little Help needed climbing 3-5 steps with a railing? : A Little 6 Click Score: 20    End of Session Equipment Utilized During Treatment: Gait belt Activity Tolerance: Patient tolerated treatment well Patient left: in chair;with call bell/phone within reach;with nursing/sitter in room;Other (comment) (nurse attending to pt (putting on polar care and setting pt up)) Nurse Communication: Mobility status;Precautions;Weight bearing status PT Visit Diagnosis: Other abnormalities of gait and mobility (R26.89);Muscle weakness (generalized) (M62.81);Pain Pain - Right/Left: Right Pain - part of body: Knee     Time: 0850-0906 PT Time Calculation (min) (ACUTE ONLY): 16 min  Charges:  $Gait Training: 8-22 mins $Therapeutic  Exercise: 8-22 mins                     Leitha Bleak, PT 09/17/21, 9:37 AM

## 2021-09-17 NOTE — Progress Notes (Signed)
°   09/17/21 0700  Clinical Encounter Type  Visited With Patient  Visit Type Initial;Post-op;Spiritual support   Patient is very encouraged with how the procedure went. Chaplain facilitated conversation and prayer for patient's recovery

## 2021-09-18 DIAGNOSIS — K219 Gastro-esophageal reflux disease without esophagitis: Secondary | ICD-10-CM | POA: Diagnosis not present

## 2021-09-18 DIAGNOSIS — Z7901 Long term (current) use of anticoagulants: Secondary | ICD-10-CM | POA: Diagnosis not present

## 2021-09-18 DIAGNOSIS — Z471 Aftercare following joint replacement surgery: Secondary | ICD-10-CM | POA: Diagnosis not present

## 2021-09-18 DIAGNOSIS — Z9049 Acquired absence of other specified parts of digestive tract: Secondary | ICD-10-CM | POA: Diagnosis not present

## 2021-09-18 DIAGNOSIS — M059 Rheumatoid arthritis with rheumatoid factor, unspecified: Secondary | ICD-10-CM | POA: Diagnosis not present

## 2021-09-18 DIAGNOSIS — I10 Essential (primary) hypertension: Secondary | ICD-10-CM | POA: Diagnosis not present

## 2021-09-18 DIAGNOSIS — E785 Hyperlipidemia, unspecified: Secondary | ICD-10-CM | POA: Diagnosis not present

## 2021-09-18 DIAGNOSIS — Z86010 Personal history of colonic polyps: Secondary | ICD-10-CM | POA: Diagnosis not present

## 2021-09-18 DIAGNOSIS — Z96653 Presence of artificial knee joint, bilateral: Secondary | ICD-10-CM | POA: Diagnosis not present

## 2021-10-01 DIAGNOSIS — Z96651 Presence of right artificial knee joint: Secondary | ICD-10-CM | POA: Diagnosis not present

## 2021-10-04 DIAGNOSIS — Z96651 Presence of right artificial knee joint: Secondary | ICD-10-CM | POA: Diagnosis not present

## 2021-10-08 DIAGNOSIS — Z96651 Presence of right artificial knee joint: Secondary | ICD-10-CM | POA: Diagnosis not present

## 2021-10-11 DIAGNOSIS — Z96651 Presence of right artificial knee joint: Secondary | ICD-10-CM | POA: Diagnosis not present

## 2021-10-15 DIAGNOSIS — Z96651 Presence of right artificial knee joint: Secondary | ICD-10-CM | POA: Diagnosis not present

## 2021-10-18 DIAGNOSIS — Z96651 Presence of right artificial knee joint: Secondary | ICD-10-CM | POA: Diagnosis not present

## 2021-10-21 DIAGNOSIS — Z79899 Other long term (current) drug therapy: Secondary | ICD-10-CM | POA: Diagnosis not present

## 2021-10-21 DIAGNOSIS — M069 Rheumatoid arthritis, unspecified: Secondary | ICD-10-CM | POA: Diagnosis not present

## 2021-10-21 DIAGNOSIS — R058 Other specified cough: Secondary | ICD-10-CM | POA: Diagnosis not present

## 2021-10-21 DIAGNOSIS — Z96651 Presence of right artificial knee joint: Secondary | ICD-10-CM | POA: Diagnosis not present

## 2021-10-21 DIAGNOSIS — M0579 Rheumatoid arthritis with rheumatoid factor of multiple sites without organ or systems involvement: Secondary | ICD-10-CM | POA: Diagnosis not present

## 2021-10-21 DIAGNOSIS — M0689 Other specified rheumatoid arthritis, multiple sites: Secondary | ICD-10-CM | POA: Diagnosis not present

## 2021-10-23 DIAGNOSIS — Z96651 Presence of right artificial knee joint: Secondary | ICD-10-CM | POA: Diagnosis not present

## 2021-10-29 DIAGNOSIS — Z96651 Presence of right artificial knee joint: Secondary | ICD-10-CM | POA: Diagnosis not present

## 2021-12-17 DIAGNOSIS — E785 Hyperlipidemia, unspecified: Secondary | ICD-10-CM | POA: Diagnosis not present

## 2021-12-17 DIAGNOSIS — I10 Essential (primary) hypertension: Secondary | ICD-10-CM | POA: Diagnosis not present

## 2021-12-24 DIAGNOSIS — M059 Rheumatoid arthritis with rheumatoid factor, unspecified: Secondary | ICD-10-CM | POA: Diagnosis not present

## 2021-12-24 DIAGNOSIS — I10 Essential (primary) hypertension: Secondary | ICD-10-CM | POA: Diagnosis not present

## 2021-12-24 DIAGNOSIS — G4700 Insomnia, unspecified: Secondary | ICD-10-CM | POA: Diagnosis not present

## 2021-12-24 DIAGNOSIS — D649 Anemia, unspecified: Secondary | ICD-10-CM | POA: Diagnosis not present

## 2021-12-24 DIAGNOSIS — Z0000 Encounter for general adult medical examination without abnormal findings: Secondary | ICD-10-CM | POA: Diagnosis not present

## 2021-12-24 DIAGNOSIS — E785 Hyperlipidemia, unspecified: Secondary | ICD-10-CM | POA: Diagnosis not present

## 2021-12-24 DIAGNOSIS — Z96651 Presence of right artificial knee joint: Secondary | ICD-10-CM | POA: Diagnosis not present

## 2021-12-25 ENCOUNTER — Other Ambulatory Visit: Payer: Self-pay | Admitting: Family Medicine

## 2021-12-25 DIAGNOSIS — Z1231 Encounter for screening mammogram for malignant neoplasm of breast: Secondary | ICD-10-CM

## 2022-01-13 ENCOUNTER — Ambulatory Visit: Payer: PPO | Admitting: Dermatology

## 2022-01-13 DIAGNOSIS — B351 Tinea unguium: Secondary | ICD-10-CM | POA: Diagnosis not present

## 2022-01-13 DIAGNOSIS — L814 Other melanin hyperpigmentation: Secondary | ICD-10-CM

## 2022-01-13 DIAGNOSIS — Q828 Other specified congenital malformations of skin: Secondary | ICD-10-CM | POA: Diagnosis not present

## 2022-01-13 DIAGNOSIS — L578 Other skin changes due to chronic exposure to nonionizing radiation: Secondary | ICD-10-CM

## 2022-01-13 DIAGNOSIS — L821 Other seborrheic keratosis: Secondary | ICD-10-CM | POA: Diagnosis not present

## 2022-01-13 DIAGNOSIS — L219 Seborrheic dermatitis, unspecified: Secondary | ICD-10-CM | POA: Diagnosis not present

## 2022-01-13 DIAGNOSIS — L570 Actinic keratosis: Secondary | ICD-10-CM

## 2022-01-13 MED ORDER — CALCIPOTRIENE 0.005 % EX SOLN: CUTANEOUS | 1 refills | Status: DC

## 2022-01-13 MED ORDER — FLUOROURACIL 2 % EX SOLN: CUTANEOUS | 1 refills | Status: DC

## 2022-01-13 MED ORDER — JUBLIA 10 % EX SOLN: CUTANEOUS | 2 refills | Status: DC

## 2022-01-13 NOTE — Progress Notes (Signed)
? ?Follow-Up Visit ?  ?Subjective  ?Deborah Singh is a 71 y.o. female who presents for the following: Follow-up. ? ?Patient here for 6 month follow-up Aks treated with LN2, 5FU/VitD solution.  No new spots noticed today. Seborrheic Dermatitis of the scalp has improved since starting Head and Shoulders shampoo. 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 Improved with Jublia, but came back when she stopped it- she needs a new prescription sent in.   ? ?The following portions of the chart were reviewed this encounter and updated as appropriate:  ?  ?  ? ?Review of Systems:  No other skin or systemic complaints except as noted in HPI or Assessment and Plan. ? ?Objective  ?Well appearing patient in no apparent distress; mood and affect are within normal limits. ? ?A focused examination was performed including face, trunk, extremities. Relevant physical exam findings are noted in the Assessment and Plan. ? ?Scalp ?Mild erythema/scale crown ? ?bilateral 5th toenails. R 3rd toenail ?Nail dystrophy of the bil 5th toenails and onycholysis of the R 3rd toenail ? ?right pretibia ?hypopigmented waxy patch with keratotic rim  ?  ? ? ? ?Assessment & Plan  ?Actinic Damage - Severe, confluent actinic changes with pre-cancerous actinic keratoses  ?- Severe, chronic, not at goal, secondary to cumulative UV radiation exposure over time ?- diffuse scaly erythematous macules and papules with underlying dyspigmentation scalp ?- Discussed Prescription "Field Treatment" for Severe, Chronic Confluent Actinic Changes with Pre-Cancerous Actinic Keratoses ?Field treatment involves treatment of an entire area of skin that has confluent Actinic Changes (Sun/ Ultraviolet light damage) and PreCancerous Actinic Keratoses by method of PhotoDynamic Therapy (PDT) and/or prescription Topical Chemotherapy agents such as 5-fluorouracil, 5-fluorouracil/calcipotriene, and/or imiquimod.  The purpose is to decrease the number of clinically  evident and subclinical PreCancerous lesions to prevent progression to development of skin cancer by chemically destroying early precancer changes that may or may not be visible.  It has been shown to reduce the risk of developing skin cancer in the treated area. As a result of treatment, redness, scaling, crusting, and open sores may occur during treatment course. One or more than one of these methods may be used and may have to be used several times to control, suppress and eliminate the PreCancerous changes. Discussed treatment course, expected reaction, and possible side effects. ?- Recommend daily broad spectrum sunscreen SPF 30+ to sun-exposed areas, reapply every 2 hours as needed.  ?- Staying in the shade or wearing long sleeves, sun glasses (UVA+UVB protection) and wide brim hats (4-inch brim around the entire circumference of the hat) are also recommended. ?- Call for new or changing lesions. ?- Fluorouracil Solution 2% Apply to AA scalp BID x 2-4 weeks. 5FU solution not covered so will treat longer due to lower strength.  ?Calcipotriene solution 0.005% Apply to AA scalp BID x 2-4 weeks.  ? ?Lentigines ?- Scattered tan macules ?- Due to sun exposure ?- Benign-appering, observe ?- Recommend daily broad spectrum sunscreen SPF 30+ to sun-exposed areas, reapply every 2 hours as needed. ?- Call for any changes ? ?Seborrheic Keratoses ?- Stuck-on, waxy, tan-brown papules and/or plaques  ?- Benign-appearing ?- Discussed benign etiology and prognosis. ?- Observe ?- Call for any changes ? ?Seborrheic dermatitis ?Scalp ? ?Chronic condition with duration or expected duration over one year. Currently well-controlled.  ? ?Seborrheic Dermatitis  ?-  is a chronic persistent rash characterized by pinkness and scaling most commonly of the mid face but also can occur on  the scalp (dandruff), ears; mid chest, mid back and groin.  It tends to be exacerbated by stress and cooler weather.  People who have neurologic disease may  experience new onset or exacerbation of existing seborrheic dermatitis.  The condition is not curable but treatable and can be controlled. ? ?Continue OTC head and shoulders shampoo  ? ?Tinea unguium ?bilateral 5th toenails. R 3rd toenail ? ?Chronic persistent condition.  Improving but not at goal. ? ?Continue Jublia solution qhs to toenails.  ? ?Efinaconazole (JUBLIA) 10 % SOLN - bilateral 5th toenails. R 3rd toenail ?Apply to affected toenails every night. ? ?Porokeratosis ?right pretibia ? ?Benign, observe.  ? ? ? ?Return as scheduled for TBSE. ? ?Documentation: I have reviewed the above documentation for accuracy and completeness, and I agree with the above. ? ?Brendolyn Patty MD  ? ? ?

## 2022-01-13 NOTE — Patient Instructions (Addendum)
5-Fluorouracil/Calcipotriene Patient Education  ? ?Actinic keratoses are the dry, red scaly spots on the skin caused by sun damage. A portion of these spots can turn into skin cancer with time, and treating them can help prevent development of skin cancer.  ? ?Treatment of these spots requires removal of the defective skin cells. There are various ways to remove actinic keratoses, including freezing with liquid nitrogen, treatment with creams, or treatment with a blue light procedure in the office.  ? ?5-fluorouracil cream/solution is a topical cream used to treat actinic keratoses. It works by interfering with the growth of abnormal fast-growing skin cells, such as actinic keratoses. These cells peel off and are replaced by healthy ones.  ? ?5-fluorouracil/calcipotriene is a combination of the 5-fluorouracil cream/solution with a vitamin D analog cream called calcipotriene. The calcipotriene alone does not treat actinic keratoses. However, when it is combined with 5-fluorouracil, it helps the 5-fluorouracil treat the actinic keratoses much faster so that the same results can be achieved with a much shorter treatment time. ? ?INSTRUCTIONS FOR 5-FLUOROURACIL/CALCIPOTRIENE CREAM/solution:  ? ?5-fluorouracil/calcipotriene cream/solution typically only needs to be used for 2-4 weeks.  ? ?Avoid contact with your eyes, nostrils, and mouth. Do not use 5-fluorouracil/calcipotriene cream on infected or open wounds.  ? ?You will develop redness, irritation and some crusting at areas where you have pre-cancer damage/actinic keratoses. IF YOU DEVELOP PAIN, BLEEDING, OR SIGNIFICANT CRUSTING, STOP THE TREATMENT EARLY - you have already gotten a good response and the actinic keratoses should clear up well. ? ?Wash your hands after applying 5-fluorouracil 5% cream on your scalp.  ? ?A moisturizer or sunscreen with a minimum SPF 30 should be applied each morning.  ? ?Once you have finished the treatment, you can apply a thin layer  of Vaseline twice a day to irritated areas to soothe and calm the areas more quickly. If you experience significant discomfort, contact your physician. ? ?For some patients it is necessary to repeat the treatment for best results. ? ?SIDE EFFECTS: When using 5-fluorouracil/calcipotriene cream, you may have mild irritation, such as redness, dryness, swelling, or a mild burning sensation. This usually resolves within 2 weeks. The more actinic keratoses you have, the more redness and inflammation you can expect during treatment. Eye irritation has been reported rarely. If this occurs, please let us know.  ?If you have any trouble using this cream, please call the office. If you have any other questions about this information, please do not hesitate to ask me before you leave the office. ? ? ?If You Need Anything After Your Visit ? ?If you have any questions or concerns for your doctor, please call our main line at [phone] and press option 4 to reach your doctor's medical assistant. If no one answers, please leave a voicemail as directed and we will return your call as soon as possible. Messages left after 4 pm will be answered the following business day.  ? ?You may also send [place] a message via MyChart. We typically respond to MyChart messages within 1-2 business days. ? ?For prescription refills, please ask your pharmacy to contact our office. Our fax number is [phone]. ? ?If you have an urgent issue when the clinic is closed that cannot wait until the next business day, you can page your doctor at the number below.   ? ?Please note that while we do our best to be available for urgent issues outside of office hours, we are not available 24/7.  ? ?If you have  an urgent issue and are unable to reach [place], you may choose to seek medical care at your doctor's office, retail clinic, urgent care center, or emergency room. ? ?If you have a medical emergency, please immediately call 911 or go to the emergency  department. ? ?Pager Numbers ? ?- Dr. Nehemiah Massed: [phone] ? ?- Dr. Laurence Ferrari: [phone] ? ?- Dr. Nicole Kindred: [phone] ? ?In the event of inclement weather, please call our main line at [phone] for an update on the status of any delays or closures. ? ?Dermatology Medication Tips: ?Please keep the boxes that topical medications come in in order to help keep track of the instructions about where and how to use these. Pharmacies typically print the medication instructions only on the boxes and not directly on the medication tubes.  ? ?If your medication is too expensive, please contact our office at [phone] option 4 or send [place] a message through [place].  ? ?We are unable to tell what your co-pay for medications will be in advance as this is different depending on your insurance coverage. However, we may be able to find a substitute medication at lower cost or fill out paperwork to get insurance to cover a needed medication.  ? ?If a prior authorization is required to get your medication covered by your insurance company, please allow [place] 1-2 business days to complete this process. ? ?Drug prices often vary depending on where the prescription is filled and some pharmacies may offer cheaper prices. ? ?The website www.goodrx.com contains coupons for medications through different pharmacies. The prices here do not account for what the cost may be with help from insurance (it may be cheaper with your insurance), but the website can give you the price if you did not use any insurance.  ?- You can print the associated coupon and take it with your prescription to the pharmacy.  ?- You may also stop by our office during regular business hours and pick up a GoodRx coupon card.  ?- If you need your prescription sent electronically to a different pharmacy, notify our office through [facility] or by phone at [phone] option 4. ? ? ? ? ?Si Usted Necesita Algo Despu?s de Su Visita ? ?Tambi?n puede enviarnos un  mensaje a trav?s de MyChart. Por lo general respondemos a los mensajes de MyChart en el transcurso de 1 a 2 d?as h?biles. ? ?Para renovar recetas, por favor pida a su farmacia que se ponga en contacto con nuestra oficina. Nuestro n?mero de fax es el [phone]. ? ?Si tiene un asunto urgente cuando la cl?nica est? cerrada y que no puede esperar hasta el siguiente d?a h?bil, puede llamar/localizar a su doctor(a) al n?mero que aparece a continuaci?n.  ? ?Por favor, tenga en cuenta que aunque hacemos todo lo posible para estar disponibles para asuntos urgentes fuera del horario de oficina, no estamos disponibles las 24 horas del d?a, los 7 d?as de la semana.  ? ?Si tiene un problema urgente y no puede comunicarse con nosotros, puede optar por buscar atenci?n m?dica  en el consultorio de su doctor(a), en una cl?nica privada, en un centro de atenci?n urgente o en una sala de emergencias. ? ?Si tiene Engineer, maintenance (IT) m?dica, por favor llame inmediatamente al 911 o vaya a la sala de emergencias. ? ?N?meros de b?per ? ?- Dr. Nehemiah Massed: [phone] ? ?- Dra. Moye: [phone] ? ?- Dra. Nicole Kindred: [phone] ? ?En caso de inclemencias del tiempo, por favor llame a nuestra l?nea principal al [phone] para  una actualizaci?n [employer] de cualquier retraso o cierre. ? ?Consejos para la medicaci?n en dermatolog?a: ?Por favor, guarde las cajas en las que vienen los medicamentos de uso t?pico para ayudarle a seguir las instrucciones sobre d?nde y c?mo usarlos. Las farmacias generalmente imprimen las instrucciones del medicamento s?lo en las cajas y no directamente en los tubos del [place].  ? ?Si su medicamento es muy caro, por favor, p?ngase en contacto con Zigmund Daniel llamando al [phone] y presione la opci?n 4 o env?enos un mensaje a trav?s de MyChart.  ? ?No podemos decirle cu?l ser? su copago por los medicamentos por adelantado ya que esto es diferente dependiendo de la cobertura de su seguro. Sin embargo,  es posible que podamos encontrar un medicamento sustituto a Electrical engineer un formulario para que el seguro cubra el medicamento que se considera necesario.  ? ?Si se requiere Ardelia Mems autorizaci?n previa para

## 2022-01-23 DIAGNOSIS — G4700 Insomnia, unspecified: Secondary | ICD-10-CM | POA: Diagnosis not present

## 2022-01-23 DIAGNOSIS — D649 Anemia, unspecified: Secondary | ICD-10-CM | POA: Diagnosis not present

## 2022-01-23 DIAGNOSIS — I10 Essential (primary) hypertension: Secondary | ICD-10-CM | POA: Diagnosis not present

## 2022-01-24 ENCOUNTER — Ambulatory Visit
Admission: RE | Admit: 2022-01-24 | Discharge: 2022-01-24 | Disposition: A | Discharge status: Home / Self Care | Payer: PPO | Source: Ambulatory Visit | Attending: Family Medicine | Admitting: Family Medicine

## 2022-01-24 DIAGNOSIS — Z1231 Encounter for screening mammogram for malignant neoplasm of breast: Secondary | ICD-10-CM | POA: Insufficient documentation

## 2022-02-11 DIAGNOSIS — R7989 Other specified abnormal findings of blood chemistry: Secondary | ICD-10-CM | POA: Diagnosis not present

## 2022-02-19 DIAGNOSIS — M0579 Rheumatoid arthritis with rheumatoid factor of multiple sites without organ or systems involvement: Secondary | ICD-10-CM | POA: Diagnosis not present

## 2022-02-19 DIAGNOSIS — Z79899 Other long term (current) drug therapy: Secondary | ICD-10-CM | POA: Diagnosis not present

## 2022-03-10 DIAGNOSIS — H2513 Age-related nuclear cataract, bilateral: Secondary | ICD-10-CM | POA: Diagnosis not present

## 2022-03-10 DIAGNOSIS — H04123 Dry eye syndrome of bilateral lacrimal glands: Secondary | ICD-10-CM | POA: Diagnosis not present

## 2022-03-13 DIAGNOSIS — Z96651 Presence of right artificial knee joint: Secondary | ICD-10-CM | POA: Diagnosis not present

## 2022-03-13 DIAGNOSIS — Z96652 Presence of left artificial knee joint: Secondary | ICD-10-CM | POA: Diagnosis not present

## 2022-04-07 DIAGNOSIS — L309 Dermatitis, unspecified: Secondary | ICD-10-CM | POA: Diagnosis not present

## 2022-04-11 DIAGNOSIS — L309 Dermatitis, unspecified: Secondary | ICD-10-CM | POA: Diagnosis not present

## 2022-04-19 IMAGING — DX DG KNEE 1-2V PORT*L*
2 series · 2 of 2 positions shown · non-contrast
Comparison: None.

CLINICAL DATA: Status post left total knee arthroplasty

EXAM:
PORTABLE LEFT KNEE - 1-2 VIEW

[knee ap]
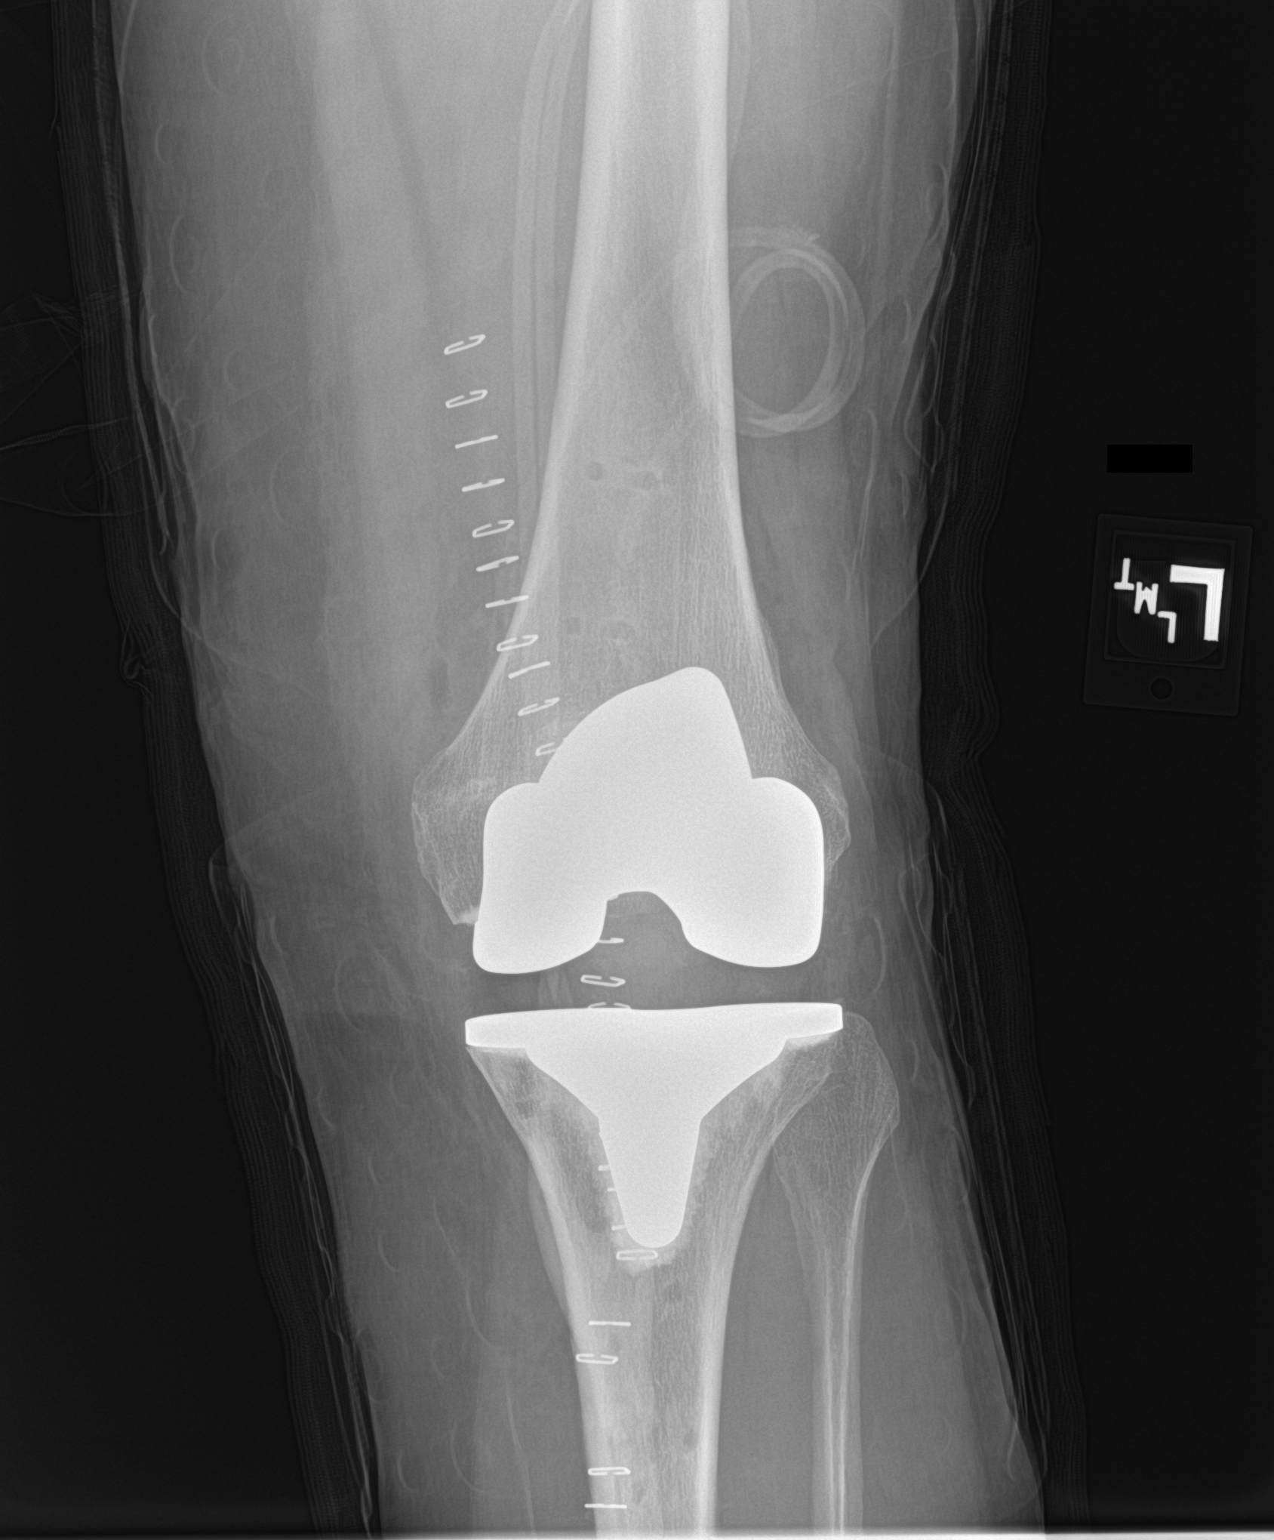

[knee lat]
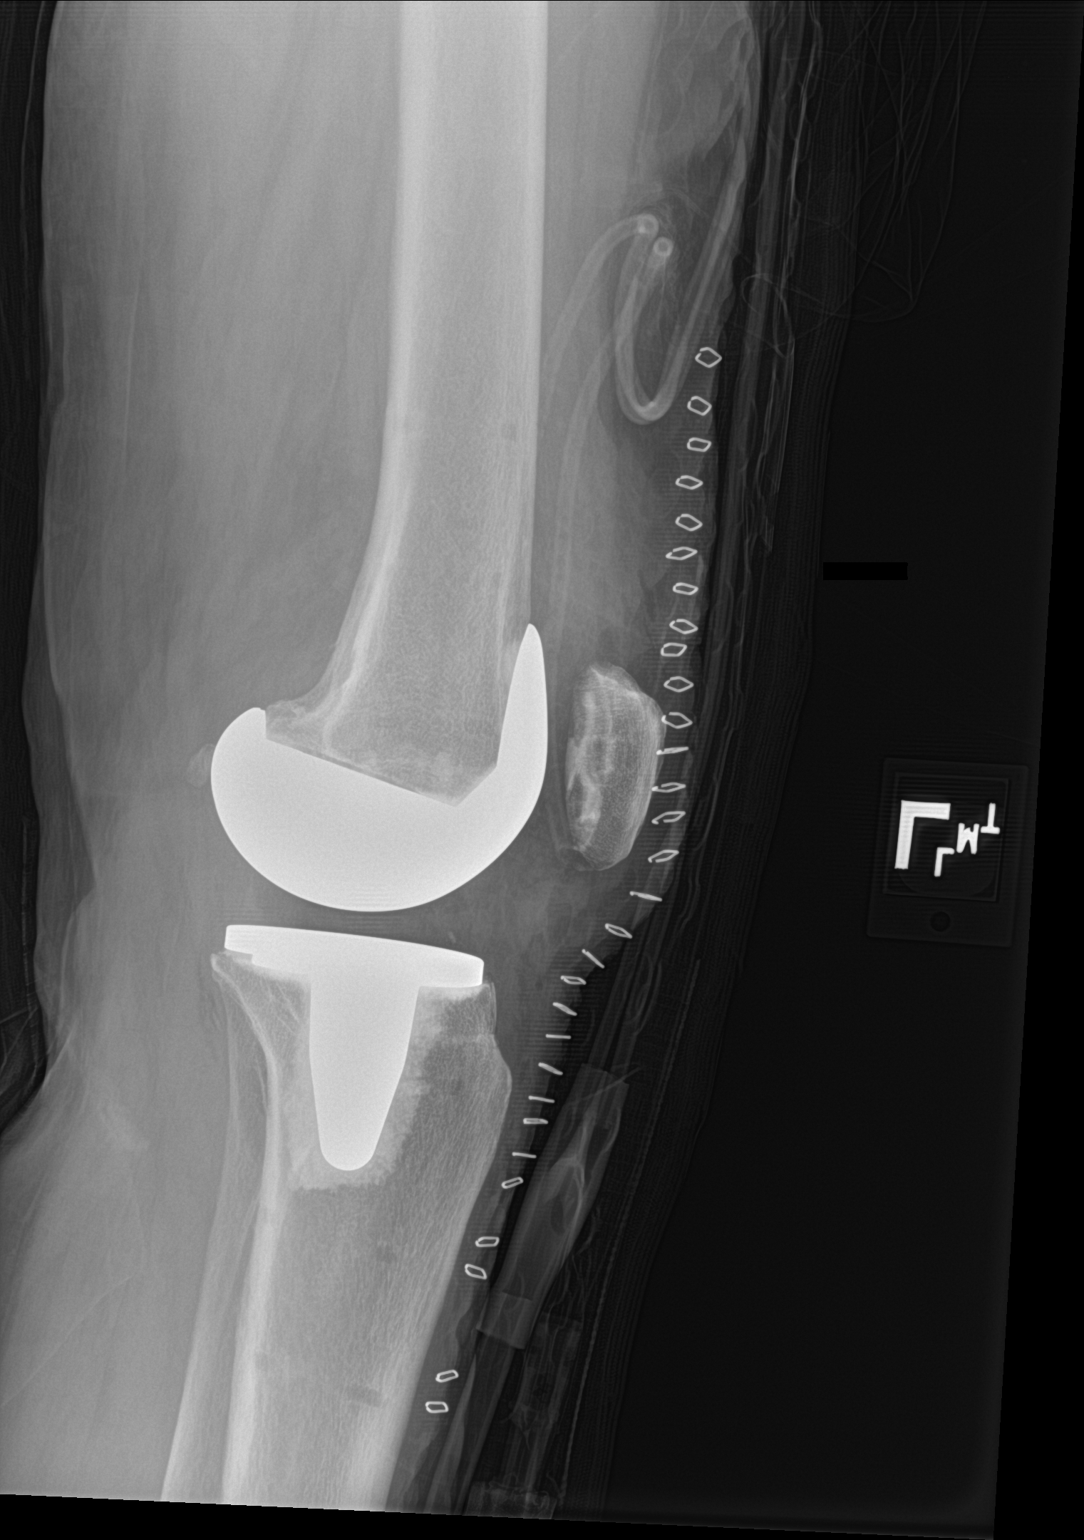

[2 of 2 positions shown; findings below may reference images not displayed]

FINDINGS: Status post left total knee arthroplasty with well-positioned distal
femoral and proximal tibial prostheses. Surgical drain terminates
over the superior right knee joint. Vertical midline anterior skin
staples. No bone fracture or dislocation. No focal osseous lesions.
Expected soft tissue gas within and surrounding the left knee joint.
IMPRESSION: Satisfactory immediate postoperative appearance status post left
total knee arthroplasty.

## 2022-06-02 DIAGNOSIS — Z79899 Other long term (current) drug therapy: Secondary | ICD-10-CM | POA: Diagnosis not present

## 2022-06-02 DIAGNOSIS — M0579 Rheumatoid arthritis with rheumatoid factor of multiple sites without organ or systems involvement: Secondary | ICD-10-CM | POA: Diagnosis not present

## 2022-06-02 DIAGNOSIS — Z13820 Encounter for screening for osteoporosis: Secondary | ICD-10-CM | POA: Diagnosis not present

## 2022-06-16 ENCOUNTER — Ambulatory Visit: Payer: PPO | Admitting: Dermatology

## 2022-06-16 DIAGNOSIS — D692 Other nonthrombocytopenic purpura: Secondary | ICD-10-CM | POA: Diagnosis not present

## 2022-06-16 DIAGNOSIS — L820 Inflamed seborrheic keratosis: Secondary | ICD-10-CM | POA: Diagnosis not present

## 2022-06-16 DIAGNOSIS — L578 Other skin changes due to chronic exposure to nonionizing radiation: Secondary | ICD-10-CM

## 2022-06-16 DIAGNOSIS — K130 Diseases of lips: Secondary | ICD-10-CM | POA: Diagnosis not present

## 2022-06-16 DIAGNOSIS — Q828 Other specified congenital malformations of skin: Secondary | ICD-10-CM | POA: Diagnosis not present

## 2022-06-16 DIAGNOSIS — L570 Actinic keratosis: Secondary | ICD-10-CM | POA: Diagnosis not present

## 2022-06-16 NOTE — Progress Notes (Signed)
Follow-Up Visit   Subjective  Deborah Singh is a 71 y.o. female who presents for the following: Spots of concern (Left forearm x 2. Areas were dark purple and have improved some now, but would like checked.). She also had a reaction on her lips about a month ago, looked like fever blisters. She was put on Prednisone and 2.5% hydrocortisone. Areas didn't heal much with topical, but improved with mupirocin 2% ointment. No new lip products, toothpaste, new food. She also has a history of AK of the scalp. Two to three weeks ago she did a treatment of 5FU/Calcipotriene solution to scalp x 2 weeks. She had a mild reaction. She also has a growth on the right pretibia that gets irritated with shaving.   The following portions of the chart were reviewed this encounter and updated as appropriate:       Review of Systems:  No other skin or systemic complaints except as noted in HPI or Assessment and Plan.  Objective  Well appearing patient in no apparent distress; mood and affect are within normal limits.  A focused examination was performed including face, scalp, arms. Relevant physical exam findings are noted in the Assessment and Plan.  Lips Erythema with xerosis on the lips; indistinct resolving papulovesicle on the right mucosal lip.  right pretibia Erythematous stuck-on, waxy papule or plaque  Scalp Diffuse erythema and mild scale post 5FU/Calcipotriene course  right pretibia 5.0 x 4.0 cm hypopigmented waxy patch with keratotic rim     Assessment & Plan  Actinic Damage - Severe, confluent actinic changes with pre-cancerous actinic keratoses  - Severe, chronic, not at goal, secondary to cumulative UV radiation exposure over time - diffuse scaly erythematous macules and papules with underlying dyspigmentation - Discussed Prescription "Field Treatment" for Severe, Chronic Confluent Actinic Changes with Pre-Cancerous Actinic Keratoses Field treatment involves treatment of an entire  area of skin that has confluent Actinic Changes (Sun/ Ultraviolet light damage) and PreCancerous Actinic Keratoses by method of PhotoDynamic Therapy (PDT) and/or prescription Topical Chemotherapy agents such as 5-fluorouracil, 5-fluorouracil/calcipotriene, and/or imiquimod.  The purpose is to decrease the number of clinically evident and subclinical PreCancerous lesions to prevent progression to development of skin cancer by chemically destroying early precancer changes that may or may not be visible.  It has been shown to reduce the risk of developing skin cancer in the treated area. As a result of treatment, redness, scaling, crusting, and open sores may occur during treatment course. One or more than one of these methods may be used and may have to be used several times to control, suppress and eliminate the PreCancerous changes. Discussed treatment course, expected reaction, and possible side effects. - Recommend daily broad spectrum sunscreen SPF 30+ to sun-exposed areas, reapply every 2 hours as needed.  - Staying in the shade or wearing long sleeves, sun glasses (UVA+UVB protection) and wide brim hats (4-inch brim around the entire circumference of the hat) are also recommended. - Call for new or changing lesions.  Seborrheic Keratoses - Stuck-on, waxy, tan-brown papules and/or plaques on the arms - Benign-appearing - Discussed benign etiology and prognosis. - Observe - Call for any changes  Purpura - Chronic; persistent and recurrent.  Treatable, but not curable. - Violaceous macules and patches on the arms - Benign - Related to trauma, age, sun damage and/or use of blood thinners, chronic use of topical and/or oral steroids - Observe - Can use OTC arnica containing moisturizer such as Dermend Bruise Formula if desired -  Call for worsening or other concerns  Cheilitis Lips  Improved.  Recommend Aquaphor to lips during the day, sample given.   If lips flare again, patient will call  office for appointment.   Inflamed seborrheic keratosis right pretibia  Symptomatic, irritating, patient would like treated. Irritated with shaving.   Destruction of lesion - right pretibia  Destruction method: cryotherapy   Informed consent: discussed and consent obtained   Lesion destroyed using liquid nitrogen: Yes   Region frozen until ice ball extended beyond lesion: Yes   Outcome: patient tolerated procedure well with no complications   Post-procedure details: wound care instructions given   Additional details:  Prior to procedure, discussed risks of blister formation, small wound, skin dyspigmentation, or rare scar following cryotherapy. Recommend Vaseline ointment to treated areas while healing.   AK (actinic keratosis) Scalp  Improved s/p 5FU/Vit D treatment.   Actinic keratoses are precancerous spots that appear secondary to cumulative UV radiation exposure/sun exposure over time. They are chronic with expected duration over 1 year. A portion of actinic keratoses will progress to squamous cell carcinoma of the skin. It is not possible to reliably predict which spots will progress to skin cancer and so treatment is recommended to prevent development of skin cancer.  Recommend daily broad spectrum sunscreen SPF 30+ to sun-exposed areas, reapply every 2 hours as needed.  Recommend staying in the shade or wearing long sleeves, sun glasses (UVA+UVB protection) and wide brim hats (4-inch brim around the entire circumference of the hat). Call for new or changing lesions.  Porokeratosis right pretibia  Benign, observe.     Return as scheduled, for TBSE.  IJamesetta Orleans, CMA, am acting as scribe for Brendolyn Patty, MD .  Documentation: I have reviewed the above documentation for accuracy and completeness, and I agree with the above.  Brendolyn Patty MD

## 2022-06-16 NOTE — Patient Instructions (Addendum)

## 2022-07-07 ENCOUNTER — Telehealth: Payer: Self-pay

## 2022-07-07 MED ORDER — MUPIROCIN 2 % EX OINT: 1.0000 | TOPICAL_OINTMENT | Freq: Two times a day (BID) | CUTANEOUS | 0 refills | Status: DC

## 2022-07-07 NOTE — Telephone Encounter (Signed)
Patient left nurse VM regarding previous office visit. She states she was told it was OK to apply Mupirocin Ointment (pt had at home) to her lips. She has been applying with improvement and has asked for a RF.  I do see a sample of Aquaphor was given and told to apply.

## 2022-07-07 NOTE — Telephone Encounter (Signed)
Patient advised and RX sent in . aw

## 2022-07-21 DIAGNOSIS — M810 Age-related osteoporosis without current pathological fracture: Secondary | ICD-10-CM | POA: Diagnosis not present

## 2022-07-22 ENCOUNTER — Ambulatory Visit: Payer: PPO | Admitting: Dermatology

## 2022-07-22 DIAGNOSIS — D225 Melanocytic nevi of trunk: Secondary | ICD-10-CM | POA: Diagnosis not present

## 2022-07-22 DIAGNOSIS — Z1283 Encounter for screening for malignant neoplasm of skin: Secondary | ICD-10-CM

## 2022-07-22 DIAGNOSIS — L700 Acne vulgaris: Secondary | ICD-10-CM | POA: Diagnosis not present

## 2022-07-22 DIAGNOSIS — I781 Nevus, non-neoplastic: Secondary | ICD-10-CM | POA: Diagnosis not present

## 2022-07-22 DIAGNOSIS — Q825 Congenital non-neoplastic nevus: Secondary | ICD-10-CM

## 2022-07-22 DIAGNOSIS — L814 Other melanin hyperpigmentation: Secondary | ICD-10-CM | POA: Diagnosis not present

## 2022-07-22 DIAGNOSIS — L820 Inflamed seborrheic keratosis: Secondary | ICD-10-CM

## 2022-07-22 DIAGNOSIS — L570 Actinic keratosis: Secondary | ICD-10-CM

## 2022-07-22 DIAGNOSIS — K130 Diseases of lips: Secondary | ICD-10-CM | POA: Diagnosis not present

## 2022-07-22 DIAGNOSIS — L219 Seborrheic dermatitis, unspecified: Secondary | ICD-10-CM | POA: Diagnosis not present

## 2022-07-22 DIAGNOSIS — Q828 Other specified congenital malformations of skin: Secondary | ICD-10-CM | POA: Diagnosis not present

## 2022-07-22 DIAGNOSIS — L821 Other seborrheic keratosis: Secondary | ICD-10-CM | POA: Diagnosis not present

## 2022-07-22 DIAGNOSIS — D229 Melanocytic nevi, unspecified: Secondary | ICD-10-CM

## 2022-07-22 DIAGNOSIS — B351 Tinea unguium: Secondary | ICD-10-CM | POA: Diagnosis not present

## 2022-07-22 MED ORDER — CLINDAMYCIN PHOS-BENZOYL PEROX 1.2-5 % EX GEL: CUTANEOUS | 6 refills | Status: DC

## 2022-07-22 MED ORDER — KETOCONAZOLE 2 % EX SHAM: MEDICATED_SHAMPOO | CUTANEOUS | 6 refills | Status: DC

## 2022-07-22 NOTE — Patient Instructions (Addendum)
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 Pearline Cables Cortibalm  For Acne  Apply duac gel to face daily to 2 times daily for acne.      Seborrheic Keratosis  What causes seborrheic keratoses? Seborrheic keratoses are harmless, common skin growths that first appear during adult life.  As time goes by, more growths appear.  Some people may develop a large number of them.  Seborrheic keratoses appear on both covered and uncovered body parts.  They are not caused by sunlight.  The tendency to develop seborrheic keratoses can be inherited.  They vary in color from skin-colored to gray, brown, or even black.  They can be either smooth or have a rough, warty surface.   Seborrheic keratoses are superficial and look as if they were stuck on the skin.  Under the microscope this type of keratosis looks like layers upon layers of skin.  That is why at times the top layer may seem to fall off, but the rest of the growth remains and re-grows.    Treatment Seborrheic keratoses do not need to be treated, but can easily be removed in the office.  Seborrheic keratoses often cause symptoms when they rub on clothing or jewelry.  Lesions can be in the way of shaving.  If they become inflamed, they can cause itching, soreness, or burning.  Removal of a seborrheic keratosis can be accomplished by freezing, burning, or surgery. If any spot bleeds, scabs, or grows rapidly, please return to have it checked, as these can be an indication of a skin cancer.  Actinic keratoses are precancerous spots that appear secondary to cumulative UV radiation exposure/sun exposure over time. They are chronic with expected duration over 1 year. A portion of actinic keratoses will progress to squamous cell carcinoma of the  skin. It is not possible to reliably predict which spots will progress to skin cancer and so treatment is recommended to prevent development of skin cancer.  Recommend daily broad spectrum sunscreen SPF 30+ to sun-exposed areas, reapply every 2 hours as needed.  Recommend staying in the shade or wearing long sleeves, sun glasses (UVA+UVB protection) and wide brim hats (4-inch brim around the entire circumference of the hat). Call for new or changing lesions.   Cryotherapy Aftercare  Wash gently with soap and water everyday.   Apply Vaseline and Band-Aid daily until healed.       Melanoma ABCDEs  Melanoma is the most dangerous type of skin cancer, and is the leading cause of death from skin disease.  You are more likely to develop melanoma if you: Have light-colored skin, light-colored eyes, or red or blond hair Spend a lot of time in the sun Tan regularly, either outdoors or in a tanning bed Have had blistering sunburns, especially during childhood Have a close family member who has had a melanoma Have atypical moles or large birthmarks  Early detection of melanoma is key since treatment is typically straightforward and cure rates are extremely high if we catch it early.   The first sign of melanoma is often a change in a mole or a new dark spot.  The ABCDE system is a way of remembering the signs of melanoma.  A for asymmetry:  The two halves do not  match. B for border:  The edges of the growth are irregular. C for color:  A mixture of colors are present instead of an even brown color. D for diameter:  Melanomas are usually (but not always) greater than 47m - the size of a pencil eraser. E for evolution:  The spot keeps changing in size, shape, and color.  Please check your skin once per month between visits. You can use a small mirror in front and a large mirror behind you to keep an eye on the back side or your body.   If you see any new or changing lesions before your next  follow-up, please call to schedule a visit.  Please continue daily skin protection including broad spectrum sunscreen SPF 30+ to sun-exposed areas, reapplying every 2 hours as needed when you're outdoors.   Staying in the shade or wearing long sleeves, sun glasses (UVA+UVB protection) and wide brim hats (4-inch brim around the entire circumference of the hat) are also recommended for sun protection.     Due to recent changes in healthcare laws, you may see results of your pathology and/or laboratory studies on MyChart before the doctors have had a chance to review them. We understand that in some cases there may be results that are confusing or concerning to you. Please understand that not all results are received at the same time and often the doctors may need to interpret multiple results in order to provide you with the best plan of care or course of treatment. Therefore, we ask that you please give u[place]2 business days to thoroughly review all your results before contacting the office for clarification. Should we see a critical lab result, you will be contacted sooner.   If You Need Anything After Your Visit  If you have any questions or concerns for your doctor, please call our main line at [phone] press option 4 to reach your doctor's medical assistant. If no one answers, please leave a voicemail as directed and we will return your call as soon as possible. Messages left after 4 pm will be answered the following business day.   You may also send [place] message via [place]amesa We typically respond to MyChart messages within 1-2 business days.  For prescription refills, please ask your pharmacy to contact our office. Our fax number is [phone]  If you have an urgent issue when the clinic is closed that cannot wait until the next business day, you can page your doctor at the number below.    Please note that while we do our best to be available for urgent issues outside of office hours,  we are not available 24/7.   If you have an urgent issue and are unable to reach u[place] you may choose to seek medical care at your doctor's office, retail clinic, urgent care center, or emergency room.  If you have a medical emergency, please immediately call 911 or go to the emergency department.  Pager Numbers  - Dr. KNehemiah Massed [phone] - Dr. MLaurence Ferrari [phone] - Dr. SNicole Kindred [phone] In the event of inclement weather, please call our main line at [phone] an update on the status of any delays or closures.  Dermatology Medication Tips: Please keep the boxes that topical medications come in in order to help keep track of the instructions about where and how to use these. Pharmacies typically print the medication instructions only on the boxes and not directly on the medication tubes.  If your medication is too expensive, please contact our office at [phone] option 4 or send [place] a message through [place].   We are unable to tell what your co-pay for medications will be in advance as this is different depending on your insurance coverage. However, we may be able to find a substitute medication at lower cost or fill out paperwork to get insurance to cover a needed medication.   If a prior authorization is required to get your medication covered by your insurance company, please allow [place] 1-2 business days to complete this process.  Drug prices often vary depending on where the prescription is filled and some pharmacies may offer cheaper prices.  The website www.goodrx.com contains coupons for medications through different pharmacies. The prices here do not account for what the cost may be with help from insurance (it may be cheaper with your insurance), but the website can give you the price if you did not use any insurance.  - You can print the associated coupon and take it with your prescription to the pharmacy.  - You may also stop by our office during regular  business hours and pick up a GoodRx coupon card.  - If you need your prescription sent electronically to a different pharmacy, notify our office through [facility] or by phone at [phone] option 4.     Si Usted Necesita Algo Despus de Su Visita  Tambin puede enviarnos un mensaje a travs de Pharmacist, community. Por lo general respondemos a los mensajes de MyChart en el transcurso de 1 a 2 das hbiles.  Para renovar recetas, por favor pida a su farmacia que se ponga en contacto con nuestra oficina. Harland Dingwall de fax es [place] [phone].  Si tiene un asunto urgente cuando la clnica est cerrada y que no puede esperar hasta el siguiente da hbil, puede llamar/localizar a su doctor(a) al nmero que aparece a continuacin.   Por favor, tenga en cuenta que aunque hacemos todo lo posible para estar disponibles para asuntos urgentes fuera del horario de [place], no estamos disponibles las 24 horas del da, los 7 das de la [place].   Si tiene un problema urgente y no puede comunicarse con nosotros, puede optar por buscar atencin mdica  en el consultorio de su doctor(a), en una clnica privada, en un centro de atencin urgente o en una sala de emergencias.  Si tiene Engineering geologist, por favor llame inmediatamente al 911 o vaya a la sala de emergencias.  Nmeros de bper  - Dr. Nehemiah Massed: [phone]  - Dra. Moye: [phone]  - Dra. Nicole Kindred: [phone]  En caso de inclemencias del [place], por favor llame a Johnsie Kindred principal al [phone] para una actualizacin sobre el [place] de cualquier retraso o cierre.  Consejos para la medicacin en dermatologa: Por favor, guarde las cajas en las que vienen los medicamentos de uso tpico para ayudarle a seguir las instrucciones sobre dnde y cmo usarlos. Las farmacias generalmente imprimen las instrucciones del medicamento slo en las cajas y no directamente en los tubos del [place].   Si su medicamento es muy caro, por  favor, pngase en contacto con Zigmund Daniel llamando al [phone] y presione la opcin 4 o envenos un mensaje a travs de Pharmacist, community.   No podemos decirle cul ser su copago por los medicamentos por adelantado ya que esto es diferente dependiendo de la cobertura de su seguro. Sin embargo, es posible que podamos encontrar un medicamento sustituto a Geneticist, molecular  formulario para que el seguro cubra el medicamento que se considera necesario.   Si se requiere una autorizacin previa para que su compaa de seguros [place] su medicamento, por favor permtanos de 1 a 2 das hbiles para completar este proceso.  Los precios de los medicamentos varan con frecuencia dependiendo del Environmental consultant de dnde se surte la receta y alguna farmacias pueden ofrecer precios ms baratos.  El sitio web www.goodrx.com tiene cupones para medicamentos de Airline pilot. Los precios aqu no tienen en cuenta lo que podra costar con la ayuda del seguro (puede ser ms barato con su seguro), pero el sitio web puede darle el precio si no utiliz Research scientist (physical sciences).  - Puede imprimir el cupn correspondiente y llevarlo con su receta a la farmacia.  - Tambin puede pasar por nuestra oficina durante el horario de atencin regular y Charity fundraiser una tarjeta de cupones de GoodRx.  - Si necesita que su receta se enve electrnicamente a una farmacia diferente, informe a nuestra oficina a travs de MyChart de [place] o por telfono llamando al [phone] y presione la opcin 4.

## 2022-07-22 NOTE — Progress Notes (Signed)
Follow-Up Visit   Subjective  Deborah Singh is a 71 y.o. female who presents for the following: Annual Exam (1 year tbse. Hx of seb derm, hx of aks, . Few spots at left arm, scalp, ).  Several itchy, irritated spots she would like removed as well.  She has used 5FU/Vit D to scalp for Aks in past with good response.  She also has been using mupirocin ointment for irritated lips.   The patient presents for Total-Body Skin Exam (TBSE) for skin cancer screening and mole check.  The patient has spots, moles and lesions to be evaluated, some may be new or changing and the patient has concerns that these could be cancer.  The following portions of the chart were reviewed this encounter and updated as appropriate:      Review of Systems: No other skin or systemic complaints except as noted in HPI or Assessment and Plan.   Objective  Well appearing patient in no apparent distress; mood and affect are within normal limits.  A full examination was performed including scalp, head, eyes, ears, nose, lips, neck, chest, axillae, abdomen, back, buttocks, bilateral upper extremities, bilateral lower extremities, hands, feet, fingers, toes, fingernails, and toenails. All findings within normal limits unless otherwise noted below.  Scalp Pink patches with greasy scale.   bilateral 5th toenails Nail dystrophy  left lateral abdomen 2 mm medium dark brown macule   right lower abdomen 4 x 3 mm medium dark brown macule with notch   right lower flank 3 x 1 mm medium dark brown macule   left upper arm x 1, left clavicle x 1, right hand dorsum x 1, right calf x 1, right shoulder x 1 (5) Erythematous stuck-on, waxy papule    right upper arm x 1 Violaceous speckled small plaque.   Right Lower Leg - Anterior 5 cm waxy tan patch with pink keratotic rim  upper lip Inflammatory papule on upper lip  Mid Lower Vermilion Lip Mild erythema with xerosis at lower lip   crown of scalp x 6  (6) Erythematous thin papules/macules with gritty scale.    Assessment & Plan  Seborrheic dermatitis Scalp  Chronic and persistent condition with duration or expected duration over one year. Condition is symptomatic/ bothersome to patient. Not currently at goal.   Seborrheic Dermatitis  -  is a chronic persistent rash characterized by pinkness and scaling most commonly of the mid face but also can occur on the scalp (dandruff), ears; mid chest, mid back and groin.  It tends to be exacerbated by stress and cooler weather.  People who have neurologic disease may experience new onset or exacerbation of existing seborrheic dermatitis.  The condition is not curable but treatable and can be controlled.   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 Condition is symptomatic/ bothersome to patient. Improving but not currently at goal.    Continue Jublia solution qhs to affected toenails. There may be an element of trauma caused dystrophy to BL 5th toenails  Related Medications Efinaconazole (JUBLIA) 10 % SOLN Apply to affected toenails every night.  Nevus (3) left lateral abdomen; right lower abdomen;  right lower flank  Benign-appearing. Stable compared to previous visit. Observation.  Call clinic for new or changing moles.  Recommend daily use of broad spectrum spf 30+ sunscreen to sun-exposed areas.    Inflamed seborrheic keratosis (5) left upper arm x 1, left clavicle x 1, right hand dorsum x 1, right calf x 1, right shoulder x 1  Symptomatic, irritating, patient would like treated.  Residual ISK at right shoulder inferior to white scar  Destruction  of lesion - left upper arm x 1, left clavicle x 1, right hand dorsum x 1, right calf x 1, right shoulder x 1  Destruction method: cryotherapy   Informed consent: discussed and consent obtained   Lesion destroyed using liquid nitrogen: Yes   Region frozen until ice ball extended beyond lesion: Yes   Outcome: patient tolerated procedure well with no complications   Post-procedure details: wound care instructions given   Additional details:  Prior to procedure, discussed risks of blister formation, small wound, skin dyspigmentation, or rare scar following cryotherapy. Recommend Vaseline ointment to treated areas while healing.   Vascular birthmark right upper arm x 1  Benign. Observe   Porokeratosis Right Lower Leg - Anterior  Benign. Observe.     Acne vulgaris upper lip  Start Duac gel apply 1 - 2 times daily for acne  Topical retinoid medications like tretinoin/Retin-A, adapalene/Differin, tazarotene/Fabior, and Epiduo/Epiduo Forte can cause dryness and irritation when first started. Only apply a pea-sized amount to the entire affected area. Avoid applying it around the eyes, edges of mouth and creases at the nose. If you experience irritation, use a good moisturizer first and/or apply the medicine less often. If you are doing well with the medicine, you can increase how often you use it until you are applying every night. Be careful with sun protection while using this medication as it can make you sensitive to the sun. This medicine should not be used by pregnant women.    Clindamycin-Benzoyl Per, Refr, (DUAC) gel - upper lip Apply topically to aa for acne qd/bid  Cheilitis Mid Lower Vermilion Lip  Continue mupirocin ointment apply qd/bid prn for flares  Continue aquaphor as many times as needed during day  Samples given of Dr. Pearline Cables to also try  Actinic keratosis (6) crown of scalp x 6  Actinic keratoses are precancerous spots that appear secondary to cumulative UV  radiation exposure/sun exposure over time. They are chronic with expected duration over 1 year. A portion of actinic keratoses will progress to squamous cell carcinoma of the skin. It is not possible to reliably predict which spots will progress to skin cancer and so treatment is recommended to prevent development of skin cancer.  Recommend daily broad spectrum sunscreen SPF 30+ to sun-exposed areas, reapply every 2 hours as needed.  Recommend staying in the shade or wearing long sleeves, sun glasses (UVA+UVB protection) and wide brim hats (4-inch brim around the entire circumference of the hat). Call for new or changing lesions.  Destruction of lesion - crown of scalp x 6  Destruction method: cryotherapy   Informed consent: discussed and consent obtained   Lesion destroyed using liquid nitrogen: Yes   Region frozen until ice ball extended beyond lesion: Yes   Outcome: patient tolerated procedure well with no complications   Post-procedure details: wound care instructions given   Additional details:  Prior to procedure, discussed risks of blister formation, small wound, skin dyspigmentation, or rare scar following cryotherapy. Recommend Vaseline ointment to treated areas while  healing.   Lentigines - Scattered tan macules - Due to sun exposure - Benign-appearing, observe - Recommend daily broad spectrum sunscreen SPF 30+ to sun-exposed areas, reapply every 2 hours as needed. - Call for any changes  Seborrheic Keratoses At back  - Stuck-on, waxy, tan-brown papules and/or plaques  - Benign-appearing - Discussed benign etiology and prognosis. - Observe - Call for any changes  Telangiectasia Right cheek - Dilated blood vessel - Benign appearing on exam - Call for changes  Melanocytic Nevi - Tan-brown and/or pink-flesh-colored symmetric macules and papules - Benign appearing on exam today - Observation - Call clinic for new or changing moles - Recommend daily use of broad  spectrum spf 30+ sunscreen to sun-exposed areas.   Hemangiomas - Red papules - Discussed benign nature - Observe - Call for any changes  Actinic Damage - Chronic condition, secondary to cumulative UV/sun exposure - diffuse scaly erythematous macules with underlying dyspigmentation - Recommend daily broad spectrum sunscreen SPF 30+ to sun-exposed areas, reapply every 2 hours as needed.  - Staying in the shade or wearing long sleeves, sun glasses (UVA+UVB protection) and wide brim hats (4-inch brim around the entire circumference of the hat) are also recommended for sun protection.  - Call for new or changing lesions.  Skin cancer screening performed today. Return for 6 month ak and seb derm follow up, 1 year tbse.  I, Ruthell Rummage, CMA, am acting as scribe for Brendolyn Patty, MD.  Documentation: I have reviewed the above documentation for accuracy and completeness, and I agree with the above.  Brendolyn Patty MD

## 2022-09-16 DIAGNOSIS — Z96651 Presence of right artificial knee joint: Secondary | ICD-10-CM | POA: Diagnosis not present

## 2022-09-16 DIAGNOSIS — Z96652 Presence of left artificial knee joint: Secondary | ICD-10-CM | POA: Diagnosis not present

## 2022-09-16 DIAGNOSIS — Z96653 Presence of artificial knee joint, bilateral: Secondary | ICD-10-CM | POA: Diagnosis not present

## 2022-09-16 DIAGNOSIS — M810 Age-related osteoporosis without current pathological fracture: Secondary | ICD-10-CM | POA: Diagnosis not present

## 2022-10-03 DIAGNOSIS — Z79899 Other long term (current) drug therapy: Secondary | ICD-10-CM | POA: Diagnosis not present

## 2022-10-03 DIAGNOSIS — M810 Age-related osteoporosis without current pathological fracture: Secondary | ICD-10-CM | POA: Diagnosis not present

## 2022-10-03 DIAGNOSIS — M0579 Rheumatoid arthritis with rheumatoid factor of multiple sites without organ or systems involvement: Secondary | ICD-10-CM | POA: Diagnosis not present

## 2022-11-14 DIAGNOSIS — R1310 Dysphagia, unspecified: Secondary | ICD-10-CM | POA: Diagnosis not present

## 2022-11-14 DIAGNOSIS — K219 Gastro-esophageal reflux disease without esophagitis: Secondary | ICD-10-CM | POA: Diagnosis not present

## 2022-11-14 DIAGNOSIS — Z86010 Personal history of colonic polyps: Secondary | ICD-10-CM | POA: Diagnosis not present

## 2022-11-14 DIAGNOSIS — Z800 Family history of malignant neoplasm of digestive organs: Secondary | ICD-10-CM | POA: Diagnosis not present

## 2023-01-02 ENCOUNTER — Other Ambulatory Visit: Payer: Self-pay | Admitting: Family Medicine

## 2023-01-02 DIAGNOSIS — Z1231 Encounter for screening mammogram for malignant neoplasm of breast: Secondary | ICD-10-CM

## 2023-01-12 DIAGNOSIS — I2510 Atherosclerotic heart disease of native coronary artery without angina pectoris: Secondary | ICD-10-CM | POA: Diagnosis not present

## 2023-01-12 DIAGNOSIS — Z0000 Encounter for general adult medical examination without abnormal findings: Secondary | ICD-10-CM | POA: Diagnosis not present

## 2023-01-12 DIAGNOSIS — M059 Rheumatoid arthritis with rheumatoid factor, unspecified: Secondary | ICD-10-CM | POA: Diagnosis not present

## 2023-01-12 DIAGNOSIS — E785 Hyperlipidemia, unspecified: Secondary | ICD-10-CM | POA: Diagnosis not present

## 2023-01-12 DIAGNOSIS — I10 Essential (primary) hypertension: Secondary | ICD-10-CM | POA: Diagnosis not present

## 2023-01-13 ENCOUNTER — Other Ambulatory Visit: Payer: Self-pay | Admitting: Family Medicine

## 2023-01-13 DIAGNOSIS — I2510 Atherosclerotic heart disease of native coronary artery without angina pectoris: Secondary | ICD-10-CM

## 2023-01-13 DIAGNOSIS — Z0000 Encounter for general adult medical examination without abnormal findings: Secondary | ICD-10-CM

## 2023-01-20 ENCOUNTER — Ambulatory Visit: Payer: PPO | Admitting: Dermatology

## 2023-01-20 ENCOUNTER — Ambulatory Visit
Admission: RE | Admit: 2023-01-20 | Discharge: 2023-01-20 | Disposition: A | Discharge status: Home / Self Care | Payer: PPO | Source: Ambulatory Visit | Attending: Family Medicine | Admitting: Family Medicine

## 2023-01-20 VITALS — BP 138/81 | HR 83

## 2023-01-20 DIAGNOSIS — D225 Melanocytic nevi of trunk: Secondary | ICD-10-CM

## 2023-01-20 DIAGNOSIS — L219 Seborrheic dermatitis, unspecified: Secondary | ICD-10-CM | POA: Diagnosis not present

## 2023-01-20 DIAGNOSIS — Z1283 Encounter for screening for malignant neoplasm of skin: Secondary | ICD-10-CM | POA: Diagnosis not present

## 2023-01-20 DIAGNOSIS — L821 Other seborrheic keratosis: Secondary | ICD-10-CM | POA: Diagnosis not present

## 2023-01-20 DIAGNOSIS — L820 Inflamed seborrheic keratosis: Secondary | ICD-10-CM

## 2023-01-20 DIAGNOSIS — X32XXXA Exposure to sunlight, initial encounter: Secondary | ICD-10-CM

## 2023-01-20 DIAGNOSIS — Z872 Personal history of diseases of the skin and subcutaneous tissue: Secondary | ICD-10-CM

## 2023-01-20 DIAGNOSIS — Q828 Other specified congenital malformations of skin: Secondary | ICD-10-CM

## 2023-01-20 DIAGNOSIS — I2510 Atherosclerotic heart disease of native coronary artery without angina pectoris: Secondary | ICD-10-CM

## 2023-01-20 DIAGNOSIS — L814 Other melanin hyperpigmentation: Secondary | ICD-10-CM

## 2023-01-20 DIAGNOSIS — Z0000 Encounter for general adult medical examination without abnormal findings: Secondary | ICD-10-CM

## 2023-01-20 DIAGNOSIS — L578 Other skin changes due to chronic exposure to nonionizing radiation: Secondary | ICD-10-CM

## 2023-01-20 DIAGNOSIS — W908XXA Exposure to other nonionizing radiation, initial encounter: Secondary | ICD-10-CM | POA: Diagnosis not present

## 2023-01-20 DIAGNOSIS — D229 Melanocytic nevi, unspecified: Secondary | ICD-10-CM

## 2023-01-20 NOTE — Patient Instructions (Addendum)
Seborrheic Keratosis  What causes seborrheic keratoses? Seborrheic keratoses are harmless, common skin growths that first appear during adult life.  As time goes by, more growths appear.  Some people may develop a large number of them.  Seborrheic keratoses appear on both covered and uncovered body parts.  They are not caused by sunlight.  The tendency to develop seborrheic keratoses can be inherited.  They vary in color from skin-colored to gray, brown, or even black.  They can be either smooth or have a rough, warty surface.   Seborrheic keratoses are superficial and look as if they were stuck on the skin.  Under the microscope this type of keratosis looks like layers upon layers of skin.  That is why at times the top layer may seem to fall off, but the rest of the growth remains and re-grows.    Treatment Seborrheic keratoses do not need to be treated, but can easily be removed in the office.  Seborrheic keratoses often cause symptoms when they rub on clothing or jewelry.  Lesions can be in the way of shaving.  If they become inflamed, they can cause itching, soreness, or burning.  Removal of a seborrheic keratosis can be accomplished by freezing, burning, or surgery. If any spot bleeds, scabs, or grows rapidly, please return to have it checked, as these can be an indication of a skin cancer.   Recommend daily broad spectrum sunscreen SPF 30+ to sun-exposed areas, reapply every 2 hours as needed. Call for new or changing lesions.  Staying in the shade or wearing long sleeves, sun glasses (UVA+UVB protection) and wide brim hats (4-inch brim around the entire circumference of the hat) are also recommended for sun protection.     Instructions for [facility] Medications  One or more of your medications was sent to the [facility] mail order compounding pharmacy. You will receive an email from them and can purchase the medicine through that link. It will then be mailed to your home at the  address you confirmed. If for any reason you do not receive an email from them, please check your spam folder. If you still do not find the email, please let us know. [facility] phone number is [phone].       Cryotherapy Aftercare  Wash gently with soap and water everyday.   Apply Vaseline and Band-Aid daily until healed.     Due to recent changes in healthcare laws, you may see results of your pathology and/or laboratory studies on MyChart before the doctors have had a chance to review them. We understand that in some cases there may be results that are confusing or concerning to you. Please understand that not all results are received at the same time and often the doctors may need to interpret multiple results in order to provide you with the best plan of care or course of treatment. Therefore, we ask that you please give [place] 2 business days to thoroughly review all your results before contacting the office for clarification. Should we see a critical lab result, you will be contacted sooner.   If You Need Anything After Your Visit  If you have any questions or concerns for your doctor, please call our main line at [phone] and press option 4 to reach your doctor's medical assistant. If no one answers, please leave a voicemail as directed and we will return your call as soon as possible. Messages left after 4 pm will be answered the following business day.  You may also send [place] a message via MyChart. We typically respond to MyChart messages within 1-2 business days.  For prescription refills, please ask your pharmacy to contact our office. Our fax number is [phone].  If you have an urgent issue when the clinic is closed that cannot wait until the next business day, you can page your doctor at the number below.    Please note that while we do our best to be available for urgent issues outside of office hours, we are not available 24/7.   If you have an urgent issue and  are unable to reach [place], you may choose to seek medical care at your doctor's office, retail clinic, urgent care center, or emergency room.  If you have a medical emergency, please immediately call 911 or go to the emergency department.  Pager Numbers  - Dr. Gwen Pounds: [phone]  - Dr. Neale Burly: [phone]  - Dr. Roseanne Reno: [phone]  In the event of inclement weather, please call our main line at [phone] for an update on the status of any delays or closures.  Dermatology Medication Tips: Please keep the boxes that topical medications come in in order to help keep track of the instructions about where and how to use these. Pharmacies typically print the medication instructions only on the boxes and not directly on the medication tubes.   If your medication is too expensive, please contact our office at [phone] option 4 or send [place] a message through MyChart.   We are unable to tell what your co-pay for medications will be in advance as this is different depending on your insurance coverage. However, we may be able to find a substitute medication at lower cost or fill out paperwork to get insurance to cover a needed medication.   If a prior authorization is required to get your medication covered by your insurance company, please allow [place] 1-2 business days to complete this process.  Drug prices often vary depending on where the prescription is filled and some pharmacies may offer cheaper prices.  The website www.goodrx.com contains coupons for medications through different pharmacies. The prices here do not account for what the cost may be with help from insurance (it may be cheaper with your insurance), but the website can give you the price if you did not use any insurance.  - You can print the associated coupon and take it with your prescription to the pharmacy.  - You may also stop by our office during regular business hours and pick up a GoodRx coupon card.  - If you need your  prescription sent electronically to a different pharmacy, notify our office through [facility] or by phone at [phone] option 4.     Si Usted Necesita Algo Despus de Su Visita  Tambin puede enviarnos un mensaje a travs de Clinical cytogeneticist. Por lo general respondemos a los mensajes de MyChart en el transcurso de 1 a 2 das hbiles.  Para renovar recetas, por favor pida a su farmacia que se ponga en contacto con nuestra oficina. Annie Sable de fax es [place] [phone].  Si tiene un asunto urgente cuando la clnica est cerrada y que no puede esperar hasta el siguiente da hbil, puede llamar/localizar a su doctor(a) al nmero que aparece a continuacin.   Por favor, tenga en cuenta que aunque hacemos todo lo posible para estar disponibles para asuntos urgentes fuera del horario de [place], no estamos disponibles las 24 horas del da, los 7 [address]  [address] de la [place].  Si tiene un problema urgente y no puede comunicarse con nosotros, puede optar por buscar atencin mdica  en el consultorio de su doctor(a), en una clnica privada, en un centro de atencin urgente o en una sala de emergencias.  Si tiene Engineer, drilling, por favor llame inmediatamente al 911 o vaya a la sala de emergencias.  Nmeros de bper  - Dr. Gwen Pounds: [phone]  - Dra. Moye: [phone]  - Dra. Roseanne Reno: [phone]  En caso de inclemencias del [place], por favor llame a Lacy Duverney principal al [phone] para una actualizacin sobre el [place] de cualquier retraso o cierre.  Consejos para la medicacin en dermatologa: Por favor, guarde las cajas en las que vienen los medicamentos de uso tpico para ayudarle a seguir las instrucciones sobre dnde y cmo usarlos. Las farmacias generalmente imprimen las instrucciones del medicamento slo en las cajas y no directamente en los tubos del [place].   Si su medicamento es muy caro, por favor, pngase en contacto con Rolm Gala llamando al [phone]  y presione la opcin 4 o envenos un mensaje a travs de Clinical cytogeneticist.   No podemos decirle cul ser su copago por los medicamentos por adelantado ya que esto es diferente dependiendo de la cobertura de su seguro. Sin embargo, es posible que podamos encontrar un medicamento sustituto a Audiological scientist un formulario para que el seguro cubra el medicamento que se considera necesario.   Si se requiere una autorizacin previa para que su compaa de seguros [place] su medicamento, por favor permtanos de 1 a 2 das hbiles para completar [address]  Los precios de los medicamentos varan con frecuencia dependiendo del Environmental consultant de dnde se surte la receta y alguna farmacias pueden ofrecer precios ms baratos.  El sitio web www.goodrx.com tiene cupones para medicamentos de Health and safety inspector. Los precios aqu no tienen en cuenta lo que podra costar con la ayuda del seguro (puede ser ms barato con su seguro), pero el sitio web puede darle el precio si no utiliz Tourist information centre manager.  - Puede imprimir el cupn correspondiente y llevarlo con su receta a la farmacia.  - Tambin puede pasar por nuestra oficina durante el horario de atencin regular y Education officer, museum una tarjeta de cupones de GoodRx.  - Si necesita que su receta se enve electrnicamente a una farmacia diferente, informe a nuestra oficina a travs de MyChart de [place] o por telfono llamando al [phone] y presione la opcin 4.

## 2023-01-20 NOTE — Progress Notes (Signed)
Follow-Up Visit   Subjective  Deborah Singh is a 72 y.o. female who presents for the following: Skin Cancer Screening and Full Body Skin Exam, hx of precancers   The patient presents for Total-Body Skin Exam (TBSE) for skin cancer screening and mole check. The patient has spots, moles and lesions to be evaluated, some may be new or changing and the patient has concerns that these could be cancer.    The following portions of the chart were reviewed this encounter and updated as appropriate: medications, allergies, medical history  Review of Systems:  No other skin or systemic complaints except as noted in HPI or Assessment and Plan.  Objective  Well appearing patient in no apparent distress; mood and affect are within normal limits.  A full examination was performed including scalp, head, eyes, ears, nose, lips, neck, chest, axillae, abdomen, back, buttocks, bilateral upper extremities, bilateral lower extremities, hands, feet, fingers, toes, fingernails, and toenails. All findings within normal limits unless otherwise noted below.   Relevant physical exam findings are noted in the Assessment and Plan.  Right Forearm x 2, right posterior thigh x 2, right shoulder x 2, (6) Pink keratotic macules/papules--Discussed benign etiology and prognosis.   right spinal mid back Stuck-on, waxy, tan-brown papule  --Discussed benign etiology and prognosis.     Assessment & Plan   LENTIGINES, SEBORRHEIC KERATOSES, HEMANGIOMAS - Benign normal skin lesions - Benign-appearing - Call for any changes  NEVUS  left lateral abdomen 2 mm medium dark brown macule    right lower abdomen 4  x 3 mm medium dark brown macule with notch    right lower flank 4 x 2 mm medium dark brown macule   -- Benign appearing on exam today, Stable. - Observation - Call clinic for new or changing moles - Recommend daily use of broad spectrum spf 30+ sunscreen to sun-exposed areas.    ACTINIC DAMAGE -  Chronic condition, secondary to cumulative UV/sun exposure - diffuse scaly erythematous macules with underlying dyspigmentation - Recommend daily broad spectrum sunscreen SPF 30+ to sun-exposed areas, reapply every 2 hours as needed.  - Staying in the shade or wearing long sleeves, sun glasses (UVA+UVB protection) and wide brim hats (4-inch brim around the entire circumference of the hat) are also recommended for sun protection.  - Call for new or changing lesions.  Inflamed seborrheic keratosis (6) Right Forearm x 2, right posterior thigh x 2, right shoulder x 2,  ISK vs Hypertrophic Aks   Symptomatic, irritating, patient would like treated.   Destruction of lesion - Right Forearm x 2, right posterior thigh x 2, right shoulder x 2,  Destruction method: cryotherapy   Informed consent: discussed and consent obtained   Lesion destroyed using liquid nitrogen: Yes   Region frozen until ice ball extended beyond lesion: Yes   Outcome: patient tolerated procedure well with no complications   Post-procedure details: wound care instructions given   Additional details:  Prior to procedure, discussed risks of blister formation, small wound, skin dyspigmentation, or rare scar following cryotherapy. Recommend Vaseline ointment to treated areas while healing.   Seborrheic keratosis, inflamed right spinal mid back  Symptomatic, itching, patient would like treated.   Destruction of lesion - right spinal mid back  Destruction method: cryotherapy   Informed consent: discussed and consent obtained   Lesion destroyed using liquid nitrogen: Yes   Region frozen until ice ball extended beyond lesion: Yes   Outcome: patient tolerated procedure well with no complications  Post-procedure details: wound care instructions given   Additional details:  Prior to procedure, discussed risks of blister formation, small wound, skin dyspigmentation, or rare scar following cryotherapy. Recommend Vaseline ointment to  treated areas while healing.   POROKERATOSIS  Right pretibial Exam: 4.5 x 5.0 cm hypopigmented waxy patch with keratotic rim   Treatment Plan: Benign, Observe.  Start [facility] medicinals Cholesterol 2% / Lovastatin 2% Cream bid  Seborrheic dermatitis Scalp Exam:  Pink scaliness at crown   Chronic and persistent condition with duration or expected duration over one year. Condition is symptomatic/ bothersome to patient. Not currently at goal.    Seborrheic Dermatitis  -  is a chronic persistent rash characterized by pinkness and scaling most commonly of the mid face but also can occur on the scalp (dandruff), ears; mid chest, mid back and groin.  It tends to be exacerbated by stress and cooler weather.  People who have neurologic disease may experience new onset or exacerbation of existing seborrheic dermatitis.  The condition is not curable but treatable and can be controlled.   Continue Ketoconazole 2% shampoo apply every other day, massage into scalp and leave in for 3 - 5 minutes before rinsing out  Vascular birthmark Right Upper Arm   Benign, observe.      HISTORY OF PRECANCEROUS ACTINIC KERATOSIS - site(s) of PreCancerous Actinic Keratosis clear today. Scalp clear today, s/p 5FU/VitD  - these may recur and new lesions may form requiring treatment to prevent transformation into skin cancer - observe for new or changing spots and contact [facility] for appointment if occur - photoprotection with sun protective clothing; sunglasses and broad spectrum sunscreen with SPF of at least 30 + and frequent self skin exams recommended - yearly exams by a dermatologist recommended for persons with history of PreCancerous Actinic Keratoses    SKIN CANCER SCREENING PERFORMED TODAY.   Return in about 1 year (around 01/20/2024) for TBSE, hx of AKs, Seborrheic dermatitis .  I, Angelique Holm, CMA, am acting as scribe for Willeen Niece, MD .   Documentation: I have reviewed the above  documentation for accuracy and completeness, and I agree with the above.  Willeen Niece, MD

## 2023-01-27 ENCOUNTER — Ambulatory Visit
Admission: RE | Admit: 2023-01-27 | Discharge: 2023-01-27 | Disposition: A | Discharge status: Home / Self Care | Payer: PPO | Source: Ambulatory Visit | Attending: Family Medicine | Admitting: Family Medicine

## 2023-01-27 DIAGNOSIS — Z1231 Encounter for screening mammogram for malignant neoplasm of breast: Secondary | ICD-10-CM | POA: Diagnosis not present

## 2023-02-16 DIAGNOSIS — M0579 Rheumatoid arthritis with rheumatoid factor of multiple sites without organ or systems involvement: Secondary | ICD-10-CM | POA: Diagnosis not present

## 2023-02-16 DIAGNOSIS — R319 Hematuria, unspecified: Secondary | ICD-10-CM | POA: Diagnosis not present

## 2023-02-16 DIAGNOSIS — Z79899 Other long term (current) drug therapy: Secondary | ICD-10-CM | POA: Diagnosis not present

## 2023-02-16 DIAGNOSIS — M810 Age-related osteoporosis without current pathological fracture: Secondary | ICD-10-CM | POA: Diagnosis not present

## 2023-02-16 DIAGNOSIS — K219 Gastro-esophageal reflux disease without esophagitis: Secondary | ICD-10-CM | POA: Diagnosis not present

## 2023-02-18 ENCOUNTER — Encounter: Payer: Self-pay | Admitting: Gastroenterology

## 2023-02-18 NOTE — H&P (Signed)
Pre-Procedure H&P   Patient ID: Deborah Singh is a 72 y.o. female.  Gastroenterology Provider: Jaynie Collins, DO  Referring Provider: Fransico Setters, NP PCP: Jerl Mina, MD  Date: 02/19/2023  HPI Deborah Singh is a 72 y.o. female who presents today for Esophagogastroduodenoscopy and Colonoscopy for Dysphagia, GERD; personal history of colon polyps and family history of colon polyps and colon cancer .  Patient with a personal history of colon polyps.  She had 1 tubular adenoma in 2008.  Otherwise had colonoscopies and 1994, flex sig 99, colonoscopy 2013 and 2018 otherwise normal aside from internal hemorrhoids. No previous upper endoscopy  She notes hard every other day bowel movements without melena or hematochezia.  Weight and appetite have been stable.  No abdominal pain.  There is dysphagia with pills.  Good reflux control on PPI.  No issues with solids or liquids.  Denies odynophagia.  Currently on bisphosphonate which she takes per instructions.  Status post appendectomy  Most recent lab work hemoglobin 12.1 MCV 81 platelets 244,000 creatinine 0.7 iron saturation 9% iron 44 TIBC 493. Has been diagnosed with CRC in September   Past Medical History:  Diagnosis Date   Actinic keratosis    Anemia    Colon adenomas    De Quervain's tenosynovitis    GERD (gastroesophageal reflux disease)    Headache    Hyperlipidemia    Hypertension    Rheumatoid arthritis (HCC)     Past Surgical History:  Procedure Laterality Date   APPENDECTOMY     COLONOSCOPY     COLONOSCOPY WITH PROPOFOL N/A 06/17/2017   Procedure: COLONOSCOPY WITH PROPOFOL;  Surgeon: Scot Jun, MD;  Location: [facility] ENDOSCOPY;  Service: Endoscopy;  Laterality: N/A;   FLEXIBLE SIGMOIDOSCOPY     KNEE ARTHROPLASTY Left 05/24/2021   Procedure: COMPUTER ASSISTED TOTAL KNEE ARTHROPLASTY - RNFA;  Surgeon: Donato Heinz, MD;  Location: [facility] ORS;  Service: Orthopedics;  Laterality: Left;   KNEE  ARTHROPLASTY Right 09/16/2021   Procedure: COMPUTER ASSISTED TOTAL KNEE ARTHROPLASTY;  Surgeon: Donato Heinz, MD;  Location: [facility] ORS;  Service: Orthopedics;  Laterality: Right;    Family History Mother-colon polyps; paternal aunt-colorectal cancer No other h/o GI disease or malignancy  Review of Systems  Constitutional:  Negative for activity change, appetite change, chills, diaphoresis, fatigue, fever and unexpected weight change.  HENT:  Positive for trouble swallowing. Negative for voice change.   Respiratory:  Negative for shortness of breath and wheezing.   Cardiovascular:  Negative for chest pain, palpitations and leg swelling.  Gastrointestinal:  Negative for abdominal distention, abdominal pain, anal bleeding, blood in stool, constipation, diarrhea, nausea, rectal pain and vomiting.  Musculoskeletal:  Negative for arthralgias and myalgias.  Skin:  Negative for color change and pallor.  Neurological:  Negative for dizziness, syncope and weakness.  Psychiatric/Behavioral:  Negative for confusion.   All other systems reviewed and are negative.    Medications No current facility-administered medications on file prior to encounter.   Current Outpatient Medications on File Prior to Encounter  Medication Sig Dispense Refill   adalimumab (HUMIRA, 2 PEN,) 40 MG/0.4ML pen Inject into the skin every 14 (fourteen) days.     aspirin EC 81 MG tablet Take 81 mg by mouth daily. Swallow whole.     Cholecalciferol 25 MCG (1000 UT) tablet Take 1,000 Units by mouth daily.     Cyanocobalamin (VITAMIN B 12 PO) Take 3,000 mcg by mouth daily.     folic  acid (FOLVITE) 800 MCG tablet Take 1,600 mcg by mouth daily.     Krill Oil 350 MG CAPS Take 350 mg by mouth daily.     lisinopril-hydrochlorothiazide (PRINZIDE,ZESTORETIC) 10-12.5 MG tablet Take 1 tablet by mouth daily.     methotrexate 2.5 MG tablet Take 20 mg by mouth once a week.     Multiple Vitamin (MULTIVITAMIN) tablet Take 2 tablets by  mouth daily.     omeprazole (PRILOSEC) 20 MG capsule Take 20 mg by mouth daily.     Probiotic Product (PROBIOTIC DAILY PO) Take 1 tablet by mouth daily. 10 billion active cultures     simvastatin (ZOCOR) 20 MG tablet Take 20 mg by mouth daily.     acetaminophen (TYLENOL) 500 MG tablet Take 1-2 tablets (500-1,000 mg total) by mouth every 6 (six) hours as needed. 30 tablet 0   Calcipotriene 0.005 % solution Apply to scalp twice daily x 2-4 weeks as directed. Use with fluorouracil 2% solution. 60 mL 1   celecoxib (CELEBREX) 200 MG capsule Take 1 capsule (200 mg total) by mouth 2 (two) times daily. (Patient not taking: Reported on 02/19/2023) 90 capsule 0   Clindamycin-Benzoyl Per, Refr, (DUAC) gel Apply topically to aa for acne qd/bid 45 g 6   Efinaconazole (JUBLIA) 10 % SOLN Apply to affected toenails every night. 8 mL 2   enoxaparin (LOVENOX) 40 MG/0.4ML injection Inject 0.4 mLs (40 mg total) into the skin daily for 14 days. 5.6 mL 0   Fluorouracil 2 % SOLN Apply to scalp twice daily x 2-4 weeks as directed. 20 mL 1   Glycerin-Polysorbate 80 (REFRESH DRY EYE THERAPY OP) Place 1 drop into both eyes daily as needed (Dry eye).     ketoconazole (NIZORAL) 2 % shampoo apply every other day, massage into scalp and leave in for 3 - 5 minutes before rinsing out 120 mL 6   MELATONIN GUMMIES PO Take 10 mg by mouth at bedtime.     mupirocin ointment (BACTROBAN) 2 % Apply 1 Application topically 2 (two) times daily. 22 g 0   oxyCODONE (OXY IR/ROXICODONE) 5 MG immediate release tablet Take 1 tablet (5 mg total) by mouth every 4 (four) hours as needed for severe pain. (Patient not taking: Reported on 02/19/2023) 30 tablet 0   Sodium Chloride-Xylitol (XLEAR SINUS CARE SPRAY) SOLN Place 1 spray into the nose daily as needed (congestion).     traMADol (ULTRAM) 50 MG tablet Take 1 tablet (50 mg total) by mouth every 4 (four) hours as needed for moderate pain. (Patient not taking: Reported on 02/19/2023) 30 tablet 0     Pertinent medications related to GI and procedure were reviewed by me with the patient prior to the procedure   Current Facility-Administered Medications:    0.9 %  sodium chloride infusion, , Intravenous, Continuous, Jaynie Collins, DO, Last Rate: 20 mL/hr at 02/19/23 0827, New Bag at 02/19/23 0827  sodium chloride 20 mL/hr at 02/19/23 0827       Allergies  Allergen Reactions   Penicillin V     IgE = 65 (WNL) on 05/14/2021   Allergies were reviewed by me prior to the procedure  Objective   Body mass index is 22.59 kg/m. Vitals:   02/19/23 0825  BP: 133/77  Pulse: 66  Resp: 18  Temp: (!) 97 F (36.1 C)  TempSrc: Temporal  SpO2: 99%  Weight: 59.7 kg  Height: 5\' 4"  (1.626 m)     Physical Exam Vitals and nursing  note reviewed.  Constitutional:      General: She is not in acute distress.    Appearance: Normal appearance. She is not ill-appearing, toxic-appearing or diaphoretic.  HENT:     Head: Normocephalic and atraumatic.     Nose: Nose normal.     Mouth/Throat:     Mouth: Mucous membranes are moist.     Pharynx: Oropharynx is clear.  Eyes:     General: No scleral icterus.    Extraocular Movements: Extraocular movements intact.  Cardiovascular:     Rate and Rhythm: Normal rate and regular rhythm.     Heart sounds: Normal heart sounds. No murmur heard.    No friction rub. No gallop.  Pulmonary:     Effort: Pulmonary effort is normal. No respiratory distress.     Breath sounds: Normal breath sounds. No wheezing, rhonchi or rales.  Abdominal:     General: Abdomen is flat. Bowel sounds are normal. There is no distension.     Palpations: Abdomen is soft.     Tenderness: There is no abdominal tenderness. There is no guarding or rebound.  Musculoskeletal:     Cervical back: Neck supple.     Right lower leg: No edema.     Left lower leg: No edema.  Skin:    General: Skin is warm and dry.     Coloration: Skin is not jaundiced or pale.  Neurological:      General: No focal deficit present.     Mental Status: She is alert and oriented to person, place, and time. Mental status is at baseline.  Psychiatric:        Mood and Affect: Mood normal.        Behavior: Behavior normal.        Thought Content: Thought content normal.        Judgment: Judgment normal.      Assessment:  Deborah Singh is a 72 y.o. female  who presents today for Esophagogastroduodenoscopy and Colonoscopy for Dysphagia, GERD; personal history of colon polyps and family history of colon polyps and colon cancer .  Plan:  Esophagogastroduodenoscopy and Colonoscopy with possible intervention today  Esophagogastroduodenoscopy and Colonoscopy with possible biopsy, control of bleeding, polypectomy, and interventions as necessary has been discussed with the patient/patient representative. Informed consent was obtained from the patient/patient representative after explaining the indication, nature, and risks of the procedure including but not limited to death, bleeding, perforation, missed neoplasm/lesions, cardiorespiratory compromise, and reaction to medications. Opportunity for questions was given and appropriate answers were provided. Patient/patient representative has verbalized understanding is amenable to undergoing the procedure.   Jaynie Collins, DO  [facility] Gastroenterology  Portions of the record may have been created with voice recognition software. Occasional wrong-word or 'sound-a-like' substitutions may have occurred due to the inherent limitations of voice recognition software.  Read the chart carefully and recognize, using context, where substitutions may have occurred.

## 2023-02-19 ENCOUNTER — Ambulatory Visit
Admission: RE | Admit: 2023-02-19 | Discharge: 2023-02-19 | Disposition: A | Discharge status: Home / Self Care | Payer: PPO | Attending: Gastroenterology | Admitting: Gastroenterology

## 2023-02-19 ENCOUNTER — Ambulatory Visit: Payer: PPO | Admitting: Certified Registered"

## 2023-02-19 ENCOUNTER — Other Ambulatory Visit: Payer: Self-pay

## 2023-02-19 ENCOUNTER — Encounter: Payer: Self-pay | Admitting: Gastroenterology

## 2023-02-19 ENCOUNTER — Encounter
Admission: RE | Disposition: A | Discharge status: Home / Self Care | Payer: Self-pay | Source: Home / Self Care | Attending: Gastroenterology

## 2023-02-19 DIAGNOSIS — D120 Benign neoplasm of cecum: Secondary | ICD-10-CM | POA: Diagnosis not present

## 2023-02-19 DIAGNOSIS — K2970 Gastritis, unspecified, without bleeding: Secondary | ICD-10-CM | POA: Diagnosis not present

## 2023-02-19 DIAGNOSIS — Z1211 Encounter for screening for malignant neoplasm of colon: Secondary | ICD-10-CM | POA: Insufficient documentation

## 2023-02-19 DIAGNOSIS — K635 Polyp of colon: Secondary | ICD-10-CM | POA: Diagnosis not present

## 2023-02-19 DIAGNOSIS — K920 Hematemesis: Secondary | ICD-10-CM | POA: Diagnosis not present

## 2023-02-19 DIAGNOSIS — K219 Gastro-esophageal reflux disease without esophagitis: Secondary | ICD-10-CM | POA: Diagnosis not present

## 2023-02-19 DIAGNOSIS — K922 Gastrointestinal hemorrhage, unspecified: Secondary | ICD-10-CM | POA: Insufficient documentation

## 2023-02-19 DIAGNOSIS — K224 Dyskinesia of esophagus: Secondary | ICD-10-CM | POA: Insufficient documentation

## 2023-02-19 DIAGNOSIS — Z79899 Other long term (current) drug therapy: Secondary | ICD-10-CM | POA: Insufficient documentation

## 2023-02-19 DIAGNOSIS — M069 Rheumatoid arthritis, unspecified: Secondary | ICD-10-CM | POA: Insufficient documentation

## 2023-02-19 DIAGNOSIS — D124 Benign neoplasm of descending colon: Secondary | ICD-10-CM | POA: Diagnosis not present

## 2023-02-19 DIAGNOSIS — I10 Essential (primary) hypertension: Secondary | ICD-10-CM | POA: Diagnosis not present

## 2023-02-19 DIAGNOSIS — Z800 Family history of malignant neoplasm of digestive organs: Secondary | ICD-10-CM | POA: Diagnosis not present

## 2023-02-19 DIAGNOSIS — Z86010 Personal history of colonic polyps: Secondary | ICD-10-CM | POA: Diagnosis not present

## 2023-02-19 DIAGNOSIS — R1310 Dysphagia, unspecified: Secondary | ICD-10-CM | POA: Insufficient documentation

## 2023-02-19 DIAGNOSIS — D125 Benign neoplasm of sigmoid colon: Secondary | ICD-10-CM | POA: Insufficient documentation

## 2023-02-19 DIAGNOSIS — K2930 Chronic superficial gastritis without bleeding: Secondary | ICD-10-CM | POA: Diagnosis not present

## 2023-02-19 DIAGNOSIS — E785 Hyperlipidemia, unspecified: Secondary | ICD-10-CM | POA: Insufficient documentation

## 2023-02-19 DIAGNOSIS — K317 Polyp of stomach and duodenum: Secondary | ICD-10-CM | POA: Diagnosis not present

## 2023-02-19 HISTORY — PX: ESOPHAGOGASTRODUODENOSCOPY (EGD) WITH PROPOFOL: SHX5813

## 2023-02-19 HISTORY — PX: POLYPECTOMY: SHX5525

## 2023-02-19 HISTORY — PX: COLONOSCOPY WITH PROPOFOL: SHX5780

## 2023-02-19 SURGERY — COLONOSCOPY WITH PROPOFOL
Anesthesia: General

## 2023-02-19 MED ORDER — PROPOFOL 10 MG/ML IV BOLUS
INTRAVENOUS | Status: DC | PRN
  Administered 2023-02-19: 60 mg via INTRAVENOUS

## 2023-02-19 MED ORDER — SODIUM CHLORIDE 0.9 % IV SOLN: INTRAVENOUS | Status: DC

## 2023-02-19 MED ORDER — LIDOCAINE HCL (CARDIAC) PF 100 MG/5ML IV SOSY
PREFILLED_SYRINGE | INTRAVENOUS | Status: DC | PRN
  Administered 2023-02-19: 50 mg via INTRAVENOUS

## 2023-02-19 MED ORDER — PROPOFOL 500 MG/50ML IV EMUL
INTRAVENOUS | Status: DC | PRN
  Administered 2023-02-19: 120 ug/kg/min via INTRAVENOUS

## 2023-02-19 NOTE — Interval H&P Note (Signed)
History and Physical Interval Note: Preprocedure H&P from 02/19/23  was reviewed and there was no interval change after seeing and examining the patient.  Written consent was obtained from the patient after discussion of risks, benefits, and alternatives. Patient has consented to proceed with Esophagogastroduodenoscopy and Colonoscopy with possible intervention   02/19/2023 8:58 AM  [name]  has presented today for surgery, with the diagnosis of  V12.72 (ICD-9-CM) - Z86.010 (ICD-10-CM) - Hx of adenomatous colonic polyps  V16.0 (ICD-9-CM) - Z80.0 (ICD-10-CM) - FH: colon cancer  530.81 (ICD-9-CM) - K21.9 (ICD-10-CM) - Gastroesophageal reflux disease, unspecified whether esophagitis present  787.20 (ICD-9-CM) - R13.10 (ICD-10-CM) - Dysphagia, unspecified type.  The various methods of treatment have been discussed with the patient and family. After consideration of risks, benefits and other options for treatment, the patient has consented to  Procedure(s): COLONOSCOPY WITH PROPOFOL (N/A) ESOPHAGOGASTRODUODENOSCOPY (EGD) WITH PROPOFOL (N/A) as a surgical intervention.  The patient's history has been reviewed, patient examined, no change in status, stable for surgery.  I have reviewed the patient's chart and labs.  Questions were answered to the patient's satisfaction.     Deborah Singh

## 2023-02-19 NOTE — Op Note (Signed)
[facility] Gastroenterology Patient Name: Deborah Singh Procedure Date: 02/19/2023 8:51 AM MRN: [id number] Account #: [id number] Date of Birth: July 13, 1951 Admit Type: Outpatient Age: 72 Room: [facility] ENDO ROOM 1 Gender: Female Note Status: Finalized Instrument Name: Patton Salles Endoscope [id number] Procedure:             Upper GI endoscopy Indications:           Dysphagia Providers:             Trenda Moots, DO Referring MD:          Rhona Leavens. Burnett Sheng, MD (Referring MD) Medicines:             Monitored Anesthesia Care Complications:         No immediate complications. Estimated blood loss:                         Minimal. Procedure:             Pre-Anesthesia Assessment:                        - Prior to the procedure, a History and Physical was                         performed, and patient medications and allergies were                         reviewed. The patient is competent. The risks and                         benefits of the procedure and the sedation options and                         risks were discussed with the patient. All questions                         were answered and informed consent was obtained.                         Patient identification and proposed procedure were                         verified by the physician, the nurse, the anesthetist                         and the technician in the endoscopy suite. Mental                         Status Examination: alert and oriented. Airway                         Examination: normal oropharyngeal airway and neck                         mobility. Respiratory Examination: clear to                         auscultation. CV Examination: RRR, no murmurs, no S3  or S4. Prophylactic Antibiotics: The patient does not                         require prophylactic antibiotics. Prior                         Anticoagulants: The patient has taken no anticoagulant                          or antiplatelet agents. ASA Grade Assessment: II - A                         patient with mild systemic disease. After reviewing                         the risks and benefits, the patient was deemed in                         satisfactory condition to undergo the procedure. The                         anesthesia plan was to use monitored anesthesia care                         (MAC). Immediately prior to administration of                         medications, the patient was re-assessed for adequacy                         to receive sedatives. The heart rate, respiratory                         rate, oxygen saturations, blood pressure, adequacy of                         pulmonary ventilation, and response to care were                         monitored throughout the procedure. The physical                         status of the patient was re-assessed after the                         procedure.                        After obtaining informed consent, the endoscope was                         passed under direct vision. Throughout the procedure,                         the patient's blood pressure, pulse, and oxygen                         saturations were monitored continuously. The Endoscope  was introduced through the mouth, and advanced to the                         second part of duodenum. The upper GI endoscopy was                         accomplished without difficulty. The patient tolerated                         the procedure well. Findings:      The duodenal bulb, first portion of the duodenum and second portion of       the duodenum were normal. Estimated blood loss: none.      Hematin (altered blood/coffee-ground-like material) was found on the       greater curvature of the stomach. no clear source on exam. Estimated       blood loss: none.      Normal mucosa was found in the entire examined stomach. Biopsies were       taken with a cold forceps  for Helicobacter pylori testing. Estimated       blood loss was minimal.      The Z-line was regular. Estimated blood loss: none.      Esophagogastric landmarks were identified: the gastroesophageal junction       was found at 37 cm from the incisors.      Abnormal motility was noted in the esophagus. The cricopharyngeus was       normal. There is spasticity of the esophageal body. The distal       esophagus/lower esophageal sphincter is open. Tertiary peristaltic waves       are noted. The scope was withdrawn. Dilation was performed with a       Maloney dilator with no resistance at 52 Fr. The dilation site was       examined following endoscope reinsertion and showed mild mucosal       disruption. Disruption was near UES. Estimated blood loss was minimal.      The exam of the esophagus was otherwise normal. Impression:            - Normal duodenal bulb, first portion of the duodenum                         and second portion of the duodenum.                        - Hematin (altered blood/coffee-ground-like material)                         in the greater curvature of the stomach.                        - Normal mucosa was found in the entire stomach.                         Biopsied.                        - Z-line regular.                        - Esophagogastric landmarks identified.                        -  Abnormal esophageal motility, suspicious for                         presbyesophagus. Dilated. Recommendation:        - Patient has a contact number available for                         emergencies. The signs and symptoms of potential                         delayed complications were discussed with the patient.                         Return to normal activities tomorrow. Written                         discharge instructions were provided to the patient.                        - Discharge patient to home.                        - Resume previous diet.                         - Continue present medications.                        - Await pathology results.                        - Repeat upper endoscopy PRN for retreatment.                        - Return to GI office as previously scheduled.                        - proceed with colonoscopy                        - The findings and recommendations were discussed with                         the patient. Procedure Code(s):     --- Professional ---                        [phone], Esophagogastroduodenoscopy, flexible,                         transoral; with biopsy, single or multiple                        [id number], Dilation of esophagus, by unguided sound or                         bougie, single or multiple passes Diagnosis Code(s):     --- Professional ---                        K92.2, Gastrointestinal hemorrhage, unspecified  K22.4, Dyskinesia of esophagus                        R13.10, Dysphagia, unspecified CPT copyright 2022 [employer]. All rights reserved. The codes documented in this report are preliminary and upon coder review may  be revised to meet current compliance requirements. Attending Participation:      I personally performed the entire procedure. Elfredia Nevins, DO Jaynie Collins DO, DO 02/19/2023 9:15:37 AM This report has been signed electronically. Number of Addenda: 0 Note Initiated On: 02/19/2023 8:51 AM Estimated Blood Loss:  Estimated blood loss was minimal.      [facility]

## 2023-02-19 NOTE — Op Note (Signed)
[facility] Gastroenterology Patient Name: Deborah Singh Procedure Date: 02/19/2023 8:49 AM MRN: [id number] Account #: [id number] Date of Birth: 1951-05-22 Admit Type: Outpatient Age: 72 Room: [facility] ENDO ROOM 1 Gender: Female Note Status: Finalized Instrument Name: Peds Colonoscope [id number] Procedure:             Colonoscopy Indications:           High risk colon cancer surveillance: Personal history                         of colonic polyps Providers:             Trenda Moots, DO Referring MD:          Rhona Leavens. Burnett Sheng, MD (Referring MD) Medicines:             Monitored Anesthesia Care Complications:         No immediate complications. Estimated blood loss:                         Minimal. Procedure:             Pre-Anesthesia Assessment:                        - Prior to the procedure, a History and Physical was                         performed, and patient medications and allergies were                         reviewed. The patient is competent. The risks and                         benefits of the procedure and the sedation options and                         risks were discussed with the patient. All questions                         were answered and informed consent was obtained.                         Patient identification and proposed procedure were                         verified by the physician, the nurse, the anesthetist                         and the technician in the endoscopy suite. Mental                         Status Examination: alert and oriented. Airway                         Examination: normal oropharyngeal airway and neck                         mobility. Respiratory Examination: clear to  auscultation. CV Examination: RRR, no murmurs, no S3                         or S4. Prophylactic Antibiotics: The patient does not                         require prophylactic antibiotics. Prior                          Anticoagulants: The patient has taken no anticoagulant                         or antiplatelet agents. ASA Grade Assessment: II - A                         patient with mild systemic disease. After reviewing                         the risks and benefits, the patient was deemed in                         satisfactory condition to undergo the procedure. The                         anesthesia plan was to use monitored anesthesia care                         (MAC). Immediately prior to administration of                         medications, the patient was re-assessed for adequacy                         to receive sedatives. The heart rate, respiratory                         rate, oxygen saturations, blood pressure, adequacy of                         pulmonary ventilation, and response to care were                         monitored throughout the procedure. The physical                         status of the patient was re-assessed after the                         procedure.                        After obtaining informed consent, the colonoscope was                         passed under direct vision. Throughout the procedure,                         the patient's blood pressure, pulse, and oxygen  saturations were monitored continuously. The                         Colonoscope was introduced through the anus and                         advanced to the the terminal ileum, with                         identification of the appendiceal orifice and IC                         valve. The colonoscopy was somewhat difficult due to                         restricted mobility of the colon. Successful                         completion of the procedure was aided by applying                         abdominal pressure. The patient tolerated the                         procedure well. The quality of the bowel preparation                         was evaluated using the BBPS  [facility] Bowel Preparation                         Scale) with scores of: Right Colon = 3, Transverse                         Colon = 3 and Left Colon = 3 (entire mucosa seen well                         with no residual staining, small fragments of stool or                         opaque liquid). The total BBPS score equals 9. The                         terminal ileum, ileocecal valve, appendiceal orifice,                         and rectum were photographed. Findings:      The perianal and digital rectal examinations were normal. Pertinent       negatives include normal sphincter tone.      The terminal ileum appeared normal. Estimated blood loss: none.      Retroflexion in the right colon was performed.      A 1 to 2 mm polyp was found in the descending colon. The polyp was       sessile. The polyp was removed with a jumbo cold forceps. Resection and       retrieval were complete. Estimated blood loss was minimal.      Three sessile polyps were found in the sigmoid colon (1) and  cecum (2).       The polyps were 4 to 6 mm in size. These polyps were removed with a cold       snare. Resection and retrieval were complete. Estimated blood loss was       minimal.      The exam was otherwise without abnormality on direct and retroflexion       views. Impression:            - The examined portion of the ileum was normal.                        - One 1 to 2 mm polyp in the descending colon, removed                         with a jumbo cold forceps. Resected and retrieved.                        - Three 4 to 6 mm polyps in the sigmoid colon and in                         the cecum, removed with a cold snare. Resected and                         retrieved.                        - The examination was otherwise normal on direct and                         retroflexion views. Recommendation:        - Patient has a contact number available for                         emergencies. The signs and  symptoms of potential                         delayed complications were discussed with the patient.                         Return to normal activities tomorrow. Written                         discharge instructions were provided to the patient.                        - Discharge patient to home.                        - Resume previous diet.                        - Continue present medications.                        - Await pathology results.                        - Repeat colonoscopy for surveillance based on  pathology results.                        - Return to GI office as previously scheduled.                        - The findings and recommendations were discussed with                         the patient. Procedure Code(s):     --- Professional ---                        [phone], Colonoscopy, flexible; with removal of                         tumor(s), polyp(s), or other lesion(s) by snare                         technique                        45380, 59, Colonoscopy, flexible; with biopsy, single                         or multiple Diagnosis Code(s):     --- Professional ---                        Z86.010, Personal history of colonic polyps                        D12.4, Benign neoplasm of descending colon                        D12.5, Benign neoplasm of sigmoid colon                        D12.0, Benign neoplasm of cecum CPT copyright 2022 American Medical Association. All rights reserved. The codes documented in this report are preliminary and upon coder review may  be revised to meet current compliance requirements. Attending Participation:      I personally performed the entire procedure. Elfredia Nevins, DO Jaynie Collins DO, DO 02/19/2023 9:45:42 AM This report has been signed electronically. Number of Addenda: 0 Note Initiated On: 02/19/2023 8:49 AM Scope Withdrawal Time: 0 hours 16 minutes 28 seconds  Total Procedure Duration: 0 hours 22  minutes 0 seconds  Estimated Blood Loss:  Estimated blood loss was minimal.      [facility]

## 2023-02-19 NOTE — Transfer of Care (Signed)
Immediate Anesthesia Transfer of Care Note  Patient: Deborah Singh  Procedure(s) Performed: COLONOSCOPY WITH PROPOFOL ESOPHAGOGASTRODUODENOSCOPY (EGD) WITH PROPOFOL  Patient Location: Endoscopy Unit  Anesthesia Type:General  Level of Consciousness: drowsy  Airway & Oxygen Therapy: Patient Spontanous Breathing  Post-op Assessment: Report given to RN  Post vital signs: stable  Last Vitals:  Vitals Value Taken Time  BP    Temp    Pulse    Resp    SpO2      Last Pain:  Vitals:   02/19/23 0825  TempSrc: Temporal  PainSc: 0-No pain         Complications: No notable events documented.

## 2023-02-19 NOTE — Anesthesia Preprocedure Evaluation (Addendum)
Anesthesia Evaluation  Patient identified by MRN, date of birth, ID band Patient awake    Reviewed: Allergy & Precautions, NPO status , Patient's Chart, lab work & pertinent test results  History of Anesthesia Complications Negative for: history of anesthetic complications  Airway Mallampati: II  TM Distance: >3 FB Neck ROM: Full    Dental no notable dental hx. (+) Chipped   Pulmonary neg pulmonary ROS Mild cough x2 weeks, no other symptoms   Pulmonary exam normal breath sounds clear to auscultation       Cardiovascular hypertension, negative cardio ROS Normal cardiovascular exam Rhythm:Regular Rate:Normal + Systolic murmurs    Neuro/Psych  Headaches negative neurological ROS  negative psych ROS   GI/Hepatic negative GI ROS, Neg liver ROS,GERD  ,,  Endo/Other  negative endocrine ROS    Renal/GU negative Renal ROS  negative genitourinary   Musculoskeletal  (+) Arthritis , Rheumatoid disorders,    Abdominal   Peds  Hematology negative hematology ROS (+) Blood dyscrasia, anemia   Anesthesia Other Findings Past Medical History: No date: Actinic keratosis No date: Anemia No date: Colon adenomas No date: De Quervain's tenosynovitis No date: GERD (gastroesophageal reflux disease) No date: Headache No date: Hyperlipidemia No date: Hypertension No date: Rheumatoid arthritis (HCC)  Past Surgical History: No date: APPENDECTOMY No date: COLONOSCOPY 06/17/2017: COLONOSCOPY WITH PROPOFOL; N/A     Comment:  Procedure: COLONOSCOPY WITH PROPOFOL;  Surgeon: Scot Jun, MD;  Location: [facility] ENDOSCOPY;  Service:               Endoscopy;  Laterality: N/A; No date: FLEXIBLE SIGMOIDOSCOPY 05/24/2021: KNEE ARTHROPLASTY; Left     Comment:  Procedure: COMPUTER ASSISTED TOTAL KNEE ARTHROPLASTY -               RNFA;  Surgeon: Donato Heinz, MD;  Location: [facility] ORS;              Service: Orthopedics;   Laterality: Left; 09/16/2021: KNEE ARTHROPLASTY; Right     Comment:  Procedure: COMPUTER ASSISTED TOTAL KNEE ARTHROPLASTY;                Surgeon: Donato Heinz, MD;  Location: [facility] ORS;                Service: Orthopedics;  Laterality: Right;  BMI    Body Mass Index: 22.59 kg/m      Reproductive/Obstetrics negative OB ROS                             Anesthesia Physical Anesthesia Plan  ASA: 2  Anesthesia Plan: General   Post-op Pain Management: Minimal or no pain anticipated   Induction: Intravenous  PONV Risk Score and Plan: 3 and Propofol infusion, TIVA and Ondansetron  Airway Management Planned: Nasal Cannula  Additional Equipment: None  Intra-op Plan:   Post-operative Plan:   Informed Consent: I have reviewed the patients History and Physical, chart, labs and discussed the procedure including the risks, benefits and alternatives for the proposed anesthesia with the patient or authorized representative who has indicated his/her understanding and acceptance.     Dental advisory given  Plan Discussed with: CRNA and Surgeon  Anesthesia Plan Comments: (Discussed risks of anesthesia with patient, including possibility of difficulty with spontaneous ventilation under anesthesia necessitating airway intervention, PONV, and rare risks such as cardiac or respiratory or neurological events,  and allergic reactions. Discussed the role of CRNA in patient's perioperative care. Patient understands.)        Anesthesia Quick Evaluation

## 2023-02-19 NOTE — Anesthesia Postprocedure Evaluation (Signed)
Anesthesia Post Note  Patient: Deborah Singh  Procedure(s) Performed: COLONOSCOPY WITH PROPOFOL ESOPHAGOGASTRODUODENOSCOPY (EGD) WITH PROPOFOL  Patient location during evaluation: Endoscopy Anesthesia Type: General Level of consciousness: awake and alert Pain management: pain level controlled Vital Signs Assessment: post-procedure vital signs reviewed and stable Respiratory status: spontaneous breathing, nonlabored ventilation, respiratory function stable and patient connected to nasal cannula oxygen Cardiovascular status: blood pressure returned to baseline and stable Postop Assessment: no apparent nausea or vomiting Anesthetic complications: no  No notable events documented.   Last Vitals:  Vitals:   02/19/23 0943 02/19/23 0953  BP: (!) 90/59 (!) 92/59  Pulse: 62   Resp: 11   Temp: 36.5 C   SpO2: 96%     Last Pain:  Vitals:   02/19/23 1003  TempSrc:   PainSc: 0-No pain                 Stephanie Coup

## 2023-02-20 ENCOUNTER — Encounter: Payer: Self-pay | Admitting: Gastroenterology

## 2023-03-19 ENCOUNTER — Encounter: Payer: Self-pay | Admitting: Urology

## 2023-03-19 ENCOUNTER — Ambulatory Visit: Payer: PPO | Admitting: Urology

## 2023-03-19 VITALS — BP 111/74 | HR 77 | Ht 64.0 in | Wt 130.0 lb

## 2023-03-19 DIAGNOSIS — R310 Gross hematuria: Secondary | ICD-10-CM | POA: Diagnosis not present

## 2023-03-19 DIAGNOSIS — R3129 Other microscopic hematuria: Secondary | ICD-10-CM | POA: Diagnosis not present

## 2023-03-19 LAB — URINALYSIS, COMPLETE
Bilirubin, UA: NEGATIVE
Glucose, UA: NEGATIVE
Ketones, UA: NEGATIVE
Nitrite, UA: NEGATIVE
Protein,UA: NEGATIVE
Specific Gravity, UA: 1.02 (ref 1.005–1.030)
Urobilinogen, Ur: 0.2 mg/dL (ref 0.2–1.0)
pH, UA: 6 (ref 5.0–7.5)

## 2023-03-19 LAB — MICROSCOPIC EXAMINATION
Bacteria, UA: NONE SEEN
Epithelial Cells (non renal): NONE SEEN /hpf (ref 0–10)

## 2023-03-19 NOTE — Progress Notes (Signed)
Marcelle Overlie Plume,acting as a scribe for Riki Altes, MD.,have documented all relevant documentation on the behalf of Riki Altes, MD,as directed by  Riki Altes, MD while in the presence of Riki Altes, MD.  03/19/2023 3:39 PM   [name] 1951-07-08 [id number]  Referring provider: Jerl Mina, MD [address] [facility] [place],  [place] [address]  Chief Complaint  Patient presents with   Establish Care   Hematuria    HPI:  Deborah Singh is a 72 y.o. female who is referred for evaluation of microhematuria.  Urinalysis on 01/12/2023 showed 6 RBC per high power field on microscopy; follow up urinalysis on 02/16/2023 showed 5 RBC per high power field Denies gross hematuria No bothersome LUTS She has dull bilateral low back pain No previous history of urologic problems or prior urologic evaluation   PMH: Past Medical History:  Diagnosis Date   Actinic keratosis    Anemia    Colon adenomas    De Quervain's tenosynovitis    GERD (gastroesophageal reflux disease)    Headache    Hyperlipidemia    Hypertension    Rheumatoid arthritis (HCC)     Surgical History: Past Surgical History:  Procedure Laterality Date   APPENDECTOMY     COLONOSCOPY     COLONOSCOPY WITH PROPOFOL N/A 06/17/2017   Procedure: COLONOSCOPY WITH PROPOFOL;  Surgeon: Scot Jun, MD;  Location: [facility] ENDOSCOPY;  Service: Endoscopy;  Laterality: N/A;   COLONOSCOPY WITH PROPOFOL N/A 02/19/2023   Procedure: COLONOSCOPY WITH PROPOFOL;  Surgeon: Jaynie Collins, DO;  Location: [facility] ENDOSCOPY;  Service: Gastroenterology;  Laterality: N/A;   ESOPHAGOGASTRODUODENOSCOPY (EGD) WITH PROPOFOL N/A 02/19/2023   Procedure: ESOPHAGOGASTRODUODENOSCOPY (EGD) WITH PROPOFOL;  Surgeon: Jaynie Collins, DO;  Location: [facility] ENDOSCOPY;  Service: Gastroenterology;  Laterality: N/A;   FLEXIBLE SIGMOIDOSCOPY     KNEE ARTHROPLASTY Left 05/24/2021   Procedure: COMPUTER ASSISTED  TOTAL KNEE ARTHROPLASTY - RNFA;  Surgeon: Donato Heinz, MD;  Location: [facility] ORS;  Service: Orthopedics;  Laterality: Left;   KNEE ARTHROPLASTY Right 09/16/2021   Procedure: COMPUTER ASSISTED TOTAL KNEE ARTHROPLASTY;  Surgeon: Donato Heinz, MD;  Location: [facility] ORS;  Service: Orthopedics;  Laterality: Right;   POLYPECTOMY  02/19/2023   Procedure: POLYPECTOMY;  Surgeon: Jaynie Collins, DO;  Location: [facility] ENDOSCOPY;  Service: Gastroenterology;;    Home Medications:  Allergies as of 03/19/2023       Reactions   Penicillin V    IgE = 65 (WNL) on 05/14/2021        Medication List        Accurate as of March 19, 2023  3:39 PM. If you have any questions, ask your nurse or doctor.          STOP taking these medications    acetaminophen 500 MG tablet Commonly known as: TYLENOL Stopped by: Riki Altes   celecoxib 200 MG capsule Commonly known as: CELEBREX Stopped by: Verna Czech Zorina Mallin   enoxaparin 40 MG/0.4ML injection Commonly known as: LOVENOX Stopped by: Riki Altes   Jublia 10 % Soln Generic drug: Efinaconazole Stopped by: Riki Altes   MELATONIN GUMMIES PO Stopped by: Riki Altes   mupirocin ointment 2 % Commonly known as: BACTROBAN Stopped by: Riki Altes   oxyCODONE 5 MG immediate release tablet Commonly known as: Oxy IR/ROXICODONE Stopped by: Riki Altes   simvastatin 20 MG tablet Commonly known as: ZOCOR Stopped by:  Vicci Reder C Myeisha Kruser   traMADol 50 MG tablet Commonly known as: ULTRAM Stopped by: Riki Altes       TAKE these medications    aspirin EC 81 MG tablet Take 81 mg by mouth daily. Swallow whole.   Calcipotriene 0.005 % solution Apply to scalp twice daily x 2-4 weeks as directed. Use with fluorouracil 2% solution.   Cholecalciferol 25 MCG (1000 UT) tablet Take 1,000 Units by mouth daily.   Clindamycin-Benzoyl Per (Refr) gel Commonly known as: Duac Apply topically to aa for acne qd/bid   Fluorouracil  2 % Soln Apply to scalp twice daily x 2-4 weeks as directed.   folic acid 800 MCG tablet Commonly known as: FOLVITE Take 1,600 mcg by mouth daily.   Humira (2 Pen) 40 MG/0.4ML pen Generic drug: adalimumab Inject into the skin every 14 (fourteen) days.   ketoconazole 2 % shampoo Commonly known as: NIZORAL apply every other day, massage into scalp and leave in for 3 - 5 minutes before rinsing out   Krill Oil 350 MG Caps Take 350 mg by mouth daily.   lisinopril-hydrochlorothiazide 10-12.5 MG tablet Commonly known as: ZESTORETIC Take 1 tablet by mouth daily.   methotrexate 2.5 MG tablet Commonly known as: RHEUMATREX Take 20 mg by mouth once a week.   multivitamin tablet Take 2 tablets by mouth daily.   omeprazole 20 MG capsule Commonly known as: PRILOSEC Take 20 mg by mouth daily.   PROBIOTIC DAILY PO Take 1 tablet by mouth daily. 10 billion active cultures   REFRESH DRY EYE THERAPY OP Place 1 drop into both eyes daily as needed (Dry eye).   VITAMIN B 12 PO Take 3,000 mcg by mouth daily.   Xlear Sinus Care Spray Soln Place 1 spray into the nose daily as needed (congestion).        Allergies:  Allergies  Allergen Reactions   Penicillin V     IgE = 65 (WNL) on 05/14/2021    Family History: Family History  Problem Relation Age of Onset   Breast cancer Neg Hx     Social History:  reports that she has never smoked. She has never used smokeless tobacco. She reports current alcohol use of about 1.0 standard drink of alcohol per week. She reports that she does not use drugs.   Physical Exam: BP 111/74   Pulse 77   Ht 5\' 4"  (1.626 m)   Wt 130 lb (59 kg)   BMI 22.31 kg/m   Constitutional:  Alert and oriented, No acute distress. HEENT: [place] AT, moist mucus membranes.  Trachea midline, no masses. Cardiovascular: No clubbing, cyanosis, or edema. Respiratory: Normal respiratory effort, no increased work of breathing. GI: Abdomen is soft, nontender, nondistended,  no abdominal masses Skin: No rashes, bruises or suspicious lesions. Neurologic: Grossly intact, no focal deficits, moving all 4 extremities. Psychiatric: Normal mood and affect.  Laboratory Data:  Urinalysis  UA: Trace blood/ microscopy negative   Assessment & Plan:    1. Microscopic hematuria AUA risk stratification: High We discussed the recommended evaluation of high risk hematuria which consist of CT urogram and cystoscopy.  The procedures were discussed in detail and he/she has elected to proceed with further evaluation All questions were answered CTU order placed and cystoscopy was scheduled  [facility] [address] [place], [place] [address] [phone]

## 2023-03-20 DIAGNOSIS — H2512 Age-related nuclear cataract, left eye: Secondary | ICD-10-CM | POA: Diagnosis not present

## 2023-03-20 DIAGNOSIS — H52223 Regular astigmatism, bilateral: Secondary | ICD-10-CM | POA: Diagnosis not present

## 2023-03-20 DIAGNOSIS — H2511 Age-related nuclear cataract, right eye: Secondary | ICD-10-CM | POA: Diagnosis not present

## 2023-03-20 DIAGNOSIS — H5213 Myopia, bilateral: Secondary | ICD-10-CM | POA: Diagnosis not present

## 2023-03-22 ENCOUNTER — Encounter: Payer: Self-pay | Admitting: Urology

## 2023-03-25 ENCOUNTER — Ambulatory Visit
Admission: RE | Admit: 2023-03-25 | Discharge: 2023-03-25 | Disposition: A | Discharge status: Home / Self Care | Payer: PPO | Source: Ambulatory Visit | Attending: Urology | Admitting: Urology

## 2023-03-25 DIAGNOSIS — D1771 Benign lipomatous neoplasm of kidney: Secondary | ICD-10-CM | POA: Diagnosis not present

## 2023-03-25 DIAGNOSIS — R3129 Other microscopic hematuria: Secondary | ICD-10-CM | POA: Diagnosis not present

## 2023-03-25 DIAGNOSIS — N281 Cyst of kidney, acquired: Secondary | ICD-10-CM | POA: Diagnosis not present

## 2023-03-25 MED ORDER — SODIUM CHLORIDE 0.9 % IV BOLUS
250.0000 mL | Freq: Once | INTRAVENOUS | Status: AC
  Administered 2023-03-25: 250 mL via INTRAVENOUS

## 2023-03-25 MED ORDER — IOHEXOL 300 MG/ML  SOLN
125.0000 mL | Freq: Once | INTRAMUSCULAR | Status: AC | PRN
  Administered 2023-03-25: 125 mL via INTRAVENOUS

## 2023-04-07 ENCOUNTER — Ambulatory Visit: Payer: PPO | Admitting: Dermatology

## 2023-04-07 VITALS — BP 147/89 | HR 67

## 2023-04-07 DIAGNOSIS — L814 Other melanin hyperpigmentation: Secondary | ICD-10-CM

## 2023-04-07 DIAGNOSIS — L578 Other skin changes due to chronic exposure to nonionizing radiation: Secondary | ICD-10-CM | POA: Diagnosis not present

## 2023-04-07 DIAGNOSIS — L820 Inflamed seborrheic keratosis: Secondary | ICD-10-CM | POA: Diagnosis not present

## 2023-04-07 DIAGNOSIS — W908XXA Exposure to other nonionizing radiation, initial encounter: Secondary | ICD-10-CM

## 2023-04-07 DIAGNOSIS — L821 Other seborrheic keratosis: Secondary | ICD-10-CM | POA: Diagnosis not present

## 2023-04-07 NOTE — Patient Instructions (Addendum)

## 2023-04-07 NOTE — Progress Notes (Signed)
   Follow-Up Visit   Subjective  Deborah Singh is a 72 y.o. female who presents for the following: check a spot on the left shoulder changing color.   The patient has spots, moles and lesions to be evaluated, some may be new or changing and the patient may have concern these could be cancer.     The following portions of the chart were reviewed this encounter and updated as appropriate: medications, allergies, medical history  Review of Systems:  No other skin or systemic complaints except as noted in HPI or Assessment and Plan.  Objective  Well appearing patient in no apparent distress; mood and affect are within normal limits.  A focused examination was performed of the following areas:face, left shoulder  Relevant physical exam findings are noted in the Assessment and Plan.  left anterior shoulder x 1, right anterior shoulder x 1 (2) Stuck-on, waxy, tan-brown papules --Discussed benign etiology and prognosis.     Assessment & Plan   Inflamed seborrheic keratosis (2) left anterior shoulder x 1, right anterior shoulder x 1  Symptomatic, irritating, patient would like treated.   Destruction of lesion - left anterior shoulder x 1, right anterior shoulder x 1 (2) Complexity: simple   Destruction method: cryotherapy   Informed consent: discussed and consent obtained   Timeout:  patient name, date of birth, surgical site, and procedure verified Lesion destroyed using liquid nitrogen: Yes   Region frozen until ice ball extended beyond lesion: Yes   Outcome: patient tolerated procedure well with no complications   Post-procedure details: wound care instructions given     SEBORRHEIC KERATOSIS - Stuck-on, waxy, tan-brown papules and/or plaques  - Benign-appearing - Discussed benign etiology and prognosis. - Observe - Call for any changes  LENTIGINES Exam: scattered tan macules Due to sun exposure Treatment Plan: Benign-appearing, observe. Recommend daily broad  spectrum sunscreen SPF 30+ to sun-exposed areas, reapply every 2 hours as needed.  Call for any changes     ACTINIC DAMAGE - chronic, secondary to cumulative UV radiation exposure/sun exposure over time - diffuse scaly erythematous macules with underlying dyspigmentation - Recommend daily broad spectrum sunscreen SPF 30+ to sun-exposed areas, reapply every 2 hours as needed.  - Recommend staying in the shade or wearing long sleeves, sun glasses (UVA+UVB protection) and wide brim hats (4-inch brim around the entire circumference of the hat). - Call for new or changing lesions.   Return if symptoms worsen or fail to improve.  IAngelique Holm, CMA, am acting as scribe for Armida Sans, MD .   Documentation: I have reviewed the above documentation for accuracy and completeness, and I agree with the above.  Armida Sans, MD

## 2023-04-09 ENCOUNTER — Ambulatory Visit: Payer: PPO | Admitting: Urology

## 2023-04-09 ENCOUNTER — Encounter: Payer: Self-pay | Admitting: Urology

## 2023-04-09 VITALS — BP 116/71 | HR 80 | Wt 132.0 lb

## 2023-04-09 DIAGNOSIS — R3129 Other microscopic hematuria: Secondary | ICD-10-CM | POA: Diagnosis not present

## 2023-04-09 DIAGNOSIS — N281 Cyst of kidney, acquired: Secondary | ICD-10-CM

## 2023-04-09 LAB — URINALYSIS, COMPLETE
Bilirubin, UA: NEGATIVE
Glucose, UA: NEGATIVE
Ketones, UA: NEGATIVE
Leukocytes,UA: NEGATIVE
Nitrite, UA: NEGATIVE
Protein,UA: NEGATIVE
Specific Gravity, UA: 1.02 (ref 1.005–1.030)
Urobilinogen, Ur: 0.2 mg/dL (ref 0.2–1.0)
pH, UA: 7 (ref 5.0–7.5)

## 2023-04-09 LAB — MICROSCOPIC EXAMINATION: Epithelial Cells (non renal): >10 /hpf — AB (ref 0–10)

## 2023-04-09 NOTE — Progress Notes (Signed)
   04/09/23  CC:  Chief Complaint  Patient presents with   Cysto    HPI: Initially seen 03/19/2023 for microhematuria.  CTU performed 03/21/2023 showed a 2.9 cm stable right lower pole renal cyst and a 7 mm left upper pole angiomyolipoma.  The AML was seen on a previous CT scan August 2013 and measured 9 mm at that time.  The right renal cyst measured 2.0 cm.  UA today 3-10 RBC  Blood pressure 116/71, pulse 80, weight 132 lb (59.9 kg). NED. A&Ox3.   No respiratory distress   Abd soft, NT, ND Normal external genitalia with patent urethral meatus  Cystoscopy Procedure Note  Patient identification was confirmed, informed consent was obtained, and patient was prepped using Betadine solution.  Lidocaine jelly was administered per urethral meatus.    Procedure: - Flexible cystoscope introduced, without any difficulty.   - Thorough search of the bladder revealed:    normal urethral meatus    normal urothelium    no stones    no ulcers     no tumors    no urethral polyps    no trabeculation  - Ureteral orifices were normal in position and appearance.  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  No further follow-up imaging required No lower tract abnormalities on cystoscopy Discussed the etiology of microhematuria cannot always be discerned however with the CTU and cystoscopy showing no significant abnormalities the etiology is benign 33-month follow-up office visit with UA    Riki Altes, MD

## 2023-04-16 ENCOUNTER — Encounter: Payer: Self-pay | Admitting: Dermatology

## 2023-05-06 DIAGNOSIS — E785 Hyperlipidemia, unspecified: Secondary | ICD-10-CM | POA: Diagnosis not present

## 2023-06-08 DIAGNOSIS — M810 Age-related osteoporosis without current pathological fracture: Secondary | ICD-10-CM | POA: Diagnosis not present

## 2023-06-08 DIAGNOSIS — Z79899 Other long term (current) drug therapy: Secondary | ICD-10-CM | POA: Diagnosis not present

## 2023-06-08 DIAGNOSIS — Z8639 Personal history of other endocrine, nutritional and metabolic disease: Secondary | ICD-10-CM | POA: Diagnosis not present

## 2023-06-08 DIAGNOSIS — R058 Other specified cough: Secondary | ICD-10-CM | POA: Diagnosis not present

## 2023-06-08 DIAGNOSIS — M069 Rheumatoid arthritis, unspecified: Secondary | ICD-10-CM | POA: Diagnosis not present

## 2023-06-08 DIAGNOSIS — M0579 Rheumatoid arthritis with rheumatoid factor of multiple sites without organ or systems involvement: Secondary | ICD-10-CM | POA: Diagnosis not present

## 2023-09-17 DIAGNOSIS — Z96653 Presence of artificial knee joint, bilateral: Secondary | ICD-10-CM | POA: Diagnosis not present

## 2023-10-12 DIAGNOSIS — Z79899 Other long term (current) drug therapy: Secondary | ICD-10-CM | POA: Diagnosis not present

## 2023-10-12 DIAGNOSIS — M810 Age-related osteoporosis without current pathological fracture: Secondary | ICD-10-CM | POA: Diagnosis not present

## 2023-10-12 DIAGNOSIS — M0579 Rheumatoid arthritis with rheumatoid factor of multiple sites without organ or systems involvement: Secondary | ICD-10-CM | POA: Diagnosis not present

## 2023-10-13 ENCOUNTER — Other Ambulatory Visit: Payer: PPO

## 2023-10-13 ENCOUNTER — Other Ambulatory Visit: Payer: Self-pay

## 2023-10-13 DIAGNOSIS — R3129 Other microscopic hematuria: Secondary | ICD-10-CM

## 2023-10-13 LAB — URINALYSIS, COMPLETE
Bilirubin, UA: NEGATIVE
Glucose, UA: NEGATIVE
Ketones, UA: NEGATIVE
Leukocytes,UA: NEGATIVE
Nitrite, UA: NEGATIVE
Protein,UA: NEGATIVE
Specific Gravity, UA: 1.01 (ref 1.005–1.030)
Urobilinogen, Ur: 0.2 mg/dL (ref 0.2–1.0)
pH, UA: 7 (ref 5.0–7.5)

## 2023-10-13 LAB — MICROSCOPIC EXAMINATION
Bacteria, UA: NONE SEEN
WBC, UA: NONE SEEN /[HPF] (ref 0–5)

## 2023-12-17 ENCOUNTER — Other Ambulatory Visit: Payer: Self-pay | Admitting: Family Medicine

## 2023-12-17 DIAGNOSIS — Z1231 Encounter for screening mammogram for malignant neoplasm of breast: Secondary | ICD-10-CM

## 2024-01-11 DIAGNOSIS — E785 Hyperlipidemia, unspecified: Secondary | ICD-10-CM | POA: Diagnosis not present

## 2024-01-11 DIAGNOSIS — I10 Essential (primary) hypertension: Secondary | ICD-10-CM | POA: Diagnosis not present

## 2024-01-18 DIAGNOSIS — I10 Essential (primary) hypertension: Secondary | ICD-10-CM | POA: Diagnosis not present

## 2024-01-18 DIAGNOSIS — E785 Hyperlipidemia, unspecified: Secondary | ICD-10-CM | POA: Diagnosis not present

## 2024-01-18 DIAGNOSIS — R490 Dysphonia: Secondary | ICD-10-CM | POA: Diagnosis not present

## 2024-01-18 DIAGNOSIS — H04123 Dry eye syndrome of bilateral lacrimal glands: Secondary | ICD-10-CM | POA: Diagnosis not present

## 2024-01-18 DIAGNOSIS — R0989 Other specified symptoms and signs involving the circulatory and respiratory systems: Secondary | ICD-10-CM | POA: Diagnosis not present

## 2024-01-18 DIAGNOSIS — R311 Benign essential microscopic hematuria: Secondary | ICD-10-CM | POA: Diagnosis not present

## 2024-01-18 DIAGNOSIS — K219 Gastro-esophageal reflux disease without esophagitis: Secondary | ICD-10-CM | POA: Diagnosis not present

## 2024-01-18 DIAGNOSIS — Z0000 Encounter for general adult medical examination without abnormal findings: Secondary | ICD-10-CM | POA: Diagnosis not present

## 2024-01-18 DIAGNOSIS — M059 Rheumatoid arthritis with rheumatoid factor, unspecified: Secondary | ICD-10-CM | POA: Diagnosis not present

## 2024-02-01 ENCOUNTER — Ambulatory Visit: Payer: PPO | Admitting: Dermatology

## 2024-02-01 DIAGNOSIS — L219 Seborrheic dermatitis, unspecified: Secondary | ICD-10-CM

## 2024-02-01 DIAGNOSIS — L814 Other melanin hyperpigmentation: Secondary | ICD-10-CM

## 2024-02-01 DIAGNOSIS — Q828 Other specified congenital malformations of skin: Secondary | ICD-10-CM

## 2024-02-01 DIAGNOSIS — I781 Nevus, non-neoplastic: Secondary | ICD-10-CM | POA: Diagnosis not present

## 2024-02-01 DIAGNOSIS — L820 Inflamed seborrheic keratosis: Secondary | ICD-10-CM

## 2024-02-01 DIAGNOSIS — L578 Other skin changes due to chronic exposure to nonionizing radiation: Secondary | ICD-10-CM

## 2024-02-01 DIAGNOSIS — D1801 Hemangioma of skin and subcutaneous tissue: Secondary | ICD-10-CM

## 2024-02-01 DIAGNOSIS — L720 Epidermal cyst: Secondary | ICD-10-CM

## 2024-02-01 DIAGNOSIS — Z1283 Encounter for screening for malignant neoplasm of skin: Secondary | ICD-10-CM | POA: Diagnosis not present

## 2024-02-01 DIAGNOSIS — L821 Other seborrheic keratosis: Secondary | ICD-10-CM

## 2024-02-01 DIAGNOSIS — W908XXA Exposure to other nonionizing radiation, initial encounter: Secondary | ICD-10-CM

## 2024-02-01 DIAGNOSIS — L738 Other specified follicular disorders: Secondary | ICD-10-CM

## 2024-02-01 DIAGNOSIS — Z872 Personal history of diseases of the skin and subcutaneous tissue: Secondary | ICD-10-CM

## 2024-02-01 DIAGNOSIS — D229 Melanocytic nevi, unspecified: Secondary | ICD-10-CM

## 2024-02-01 DIAGNOSIS — Q825 Congenital non-neoplastic nevus: Secondary | ICD-10-CM

## 2024-02-01 MED ORDER — KETOCONAZOLE 2 % EX SHAM
MEDICATED_SHAMPOO | CUTANEOUS | 6 refills | Status: AC
Start: 2024-02-01 — End: ?

## 2024-02-01 NOTE — Patient Instructions (Addendum)
 Seborrheic Dermatitis  -  is a chronic persistent rash characterized by pinkness and scaling most commonly of the mid face but also can occur on the scalp (dandruff), ears; mid chest, mid back and groin.  It tends to be exacerbated by stress and cooler weather.  People who have neurologic disease may experience new onset or exacerbation of existing seborrheic dermatitis.  The condition is not curable but treatable and can be controlled.   Continue Ketoconazole  2% shampoo apply every other day, massage into scalp and leave in for 3 - 5 minutes before rinsing out    Continue [facility] Cholesterol 2% / Lovastatin 2% Cream twice daily at right lower leg  Instructions for [facility] Medications  One or more of your medications was sent to the [facility] mail order compounding pharmacy. You will receive an email from them and can purchase the medicine through that link. It will then be mailed to your home at the address you confirmed. If for any reason you do not receive an email from them, please check your spam folder. If you still do not find the email, please let us  know. [facility] phone number is [phone].   Seborrheic Keratosis  What causes seborrheic keratoses? Seborrheic keratoses are harmless, common skin growths that first appear during adult life.  As time goes by, more growths appear.  Some people may develop a large number of them.  Seborrheic keratoses appear on both covered and uncovered body parts.  They are not caused by sunlight.  The tendency to develop seborrheic keratoses can be inherited.  They vary in color from skin-colored to gray, brown, or even black.  They can be either smooth or have a rough, warty surface.   Seborrheic keratoses are superficial and look as if they were stuck on the skin.  Under the microscope this type of keratosis looks like layers upon layers of skin.  That is why at times the top layer may seem to fall off, but the rest of the  growth remains and re-grows.    Treatment Seborrheic keratoses do not need to be treated, but can easily be removed in the office.  Seborrheic keratoses often cause symptoms when they rub on clothing or jewelry.  Lesions can be in the way of shaving.  If they become inflamed, they can cause itching, soreness, or burning.  Removal of a seborrheic keratosis can be accomplished by freezing, burning, or surgery. If any spot bleeds, scabs, or grows rapidly, please return to have it checked, as these can be an indication of a skin cancer.    Melanoma ABCDEs  Melanoma is the most dangerous type of skin cancer, and is the leading cause of death from skin disease.  You are more likely to develop melanoma if you: Have light-colored skin, light-colored eyes, or red or blond hair Spend a lot of time in the sun Tan regularly, either outdoors or in a tanning bed Have had blistering sunburns, especially during childhood Have a close family member who has had a melanoma Have atypical moles or large birthmarks  Early detection of melanoma is key since treatment is typically straightforward and cure rates are extremely high if we catch it early.   The first sign of melanoma is often a change in a mole or a new dark spot.  The ABCDE system is a way of remembering the signs of melanoma.  A for asymmetry:  The two halves do not match. B for border:  The edges  of the growth are irregular. C for color:  A mixture of colors are present instead of an even brown color. D for diameter:  Melanomas are usually (but not always) greater than 6mm - the size of a pencil eraser. E for evolution:  The spot keeps changing in size, shape, and color.  Please check your skin once per month between visits. You can use a small mirror in front and a large mirror behind you to keep an eye on the back side or your body.   If you see any new or changing lesions before your next follow-up, please call to schedule a visit.  Please  continue daily skin protection including broad spectrum sunscreen SPF 30+ to sun-exposed areas, reapplying every 2 hours as needed when you're outdoors.   Staying in the shade or wearing long sleeves, sun glasses (UVA+UVB protection) and wide brim hats (4-inch brim around the entire circumference of the hat) are also recommended for sun protection.    Due to recent changes in healthcare laws, you may see results of your pathology and/or laboratory studies on MyChart before the doctors have had a chance to review them. We understand that in some cases there may be results that are confusing or concerning to you. Please understand that not all results are received at the same time and often the doctors may need to interpret multiple results in order to provide you with the best plan of care or course of treatment. Therefore, we ask that you please give us  2 business days to thoroughly review all your results before contacting the office for clarification. Should we see a critical lab result, you will be contacted sooner.   If You Need Anything After Your Visit  If you have any questions or concerns for your doctor, please call our main line at [phone] and press option 4 to reach your doctor's medical assistant. If no one answers, please leave a voicemail as directed and we will return your call as soon as possible. Messages left after 4 pm will be answered the following business day.   You may also send us  a message via MyChart. We typically respond to MyChart messages within 1-2 business days.  For prescription refills, please ask your pharmacy to contact our office. Our fax number is [phone].  If you have an urgent issue when the clinic is closed that cannot wait until the next business day, you can page your doctor at the number below.    Please note that while we do our best to be available for urgent issues outside of office hours, we are not available 24/7.   If you have an urgent  issue and are unable to reach us , you may choose to seek medical care at your doctor's office, retail clinic, urgent care center, or emergency room.  If you have a medical emergency, please immediately call 911 or go to the emergency department.  Pager Numbers  - Dr. Bary Likes: [phone]  - Dr. Annette Barters: [phone]  - Dr. Felipe Horton: [phone]   In the event of inclement weather, please call our main line at [phone] for an update on the status of any delays or closures.  Dermatology Medication Tips: Please keep the boxes that topical medications come in in order to help keep track of the instructions about where and how to use these. Pharmacies typically print the medication instructions only on the boxes and not directly on the medication tubes.   If your medication is too expensive, please  contact our office at [phone] option 4 or send us  a message through MyChart.   We are unable to tell what your co-pay for medications will be in advance as this is different depending on your insurance coverage. However, we may be able to find a substitute medication at lower cost or fill out paperwork to get insurance to cover a needed medication.   If a prior authorization is required to get your medication covered by your insurance company, please allow us  1-2 business days to complete this process.  Drug prices often vary depending on where the prescription is filled and some pharmacies may offer cheaper prices.  The website www.goodrx.com contains coupons for medications through different pharmacies. The prices here do not account for what the cost may be with help from insurance (it may be cheaper with your insurance), but the website can give you the price if you did not use any insurance.  - You can print the associated coupon and take it with your prescription to the pharmacy.  - You may also stop by our office during regular business hours and pick up a GoodRx coupon card.  - If  you need your prescription sent electronically to a different pharmacy, notify our office through [facility] or by phone at [phone] option 4.     Si Usted Necesita Algo Despus de Su Visita  Tambin puede enviarnos un mensaje a travs de Clinical cytogeneticist. Por lo general respondemos a los mensajes de MyChart en el transcurso de 1 a 2 das hbiles.  Para renovar recetas, por favor pida a su farmacia que se ponga en contacto con nuestra oficina. Franz Jacks de fax es [place] [phone].  Si tiene un asunto urgente cuando la clnica est cerrada y que no puede esperar hasta el siguiente da hbil, puede llamar/localizar a su doctor(a) al nmero que aparece a continuacin.   Por favor, tenga en cuenta que aunque hacemos todo lo posible para estar disponibles para asuntos urgentes fuera del horario de [place], no estamos disponibles las 24 horas del da, los 7 [address]  [address] de la [place].   Si tiene un problema urgente y no puede comunicarse con nosotros, puede optar por buscar atencin mdica  en el consultorio de su doctor(a), en una clnica privada, en un centro de atencin urgente o en una sala de emergencias.  Si tiene Engineer, drilling, por favor llame inmediatamente al 911 o vaya a la sala de emergencias.  Nmeros de bper  - Dr. Bary Likes: [phone]  - Dra. Annette Barters: [phone]  - Dr. Felipe Horton: [phone]   En caso de inclemencias del tiempo, por favor llame a Lajuan Pila principal al [phone] para una actualizacin sobre el [place] de cualquier retraso o cierre.  Consejos para la medicacin en dermatologa: Por favor, guarde las cajas en las que vienen los medicamentos de uso tpico para ayudarle a seguir las instrucciones sobre dnde y cmo usarlos. Las farmacias generalmente imprimen las instrucciones del medicamento slo en las cajas y no directamente en los tubos del [place].   Si su medicamento es muy caro, por favor, pngase en contacto con Bettyjane Brunet llamando  al [phone] y presione la opcin 4 o envenos un mensaje a travs de Clinical cytogeneticist.   No podemos decirle cul ser su copago por los medicamentos por adelantado ya que esto es diferente dependiendo de la cobertura de su seguro. Sin embargo, es posible que podamos encontrar un medicamento sustituto a Audiological scientist un formulario para que el [place]  el medicamento que se considera necesario.   Si se requiere una autorizacin previa para que su compaa de seguros [place] su medicamento, por favor permtanos de 1 a 2 das hbiles para completar este proceso.  Los precios de los medicamentos varan con frecuencia dependiendo del Environmental consultant de dnde se surte la receta y alguna farmacias pueden ofrecer precios ms baratos.  El sitio web www.goodrx.com tiene cupones para medicamentos de Health and safety inspector. Los precios aqu no tienen en cuenta lo que podra costar con la ayuda del seguro (puede ser ms barato con su seguro), pero el sitio web puede darle el precio si no utiliz Tourist information centre manager.  - Puede imprimir el cupn correspondiente y llevarlo con su receta a la farmacia.  - Tambin puede pasar por nuestra oficina durante el horario de atencin regular y Education officer, museum una tarjeta de cupones de GoodRx.  - Si necesita que su receta se enve electrnicamente a una farmacia diferente, informe a nuestra oficina a travs de MyChart de [place] o por telfono llamando al [phone] y presione la opcin 4.

## 2024-02-01 NOTE — Progress Notes (Signed)
 Follow-Up Visit   Subjective  Deborah Singh is a 73 y.o. female who presents for the following: Skin Cancer Screening and Full Body Skin Exam Hx of seb derm using ketoconazole  shampoo , hx of isk, hx of aks at scalp, hx of porokeratosis   Reports some rough spots at right arms and hand, check scalp   The patient presents for Total-Body Skin Exam (TBSE) for skin cancer screening and mole check. The patient has spots, moles and lesions to be evaluated, some may be new or changing and the patient may have concern these could be cancer.    The following portions of the chart were reviewed this encounter and updated as appropriate: medications, allergies, medical history  Review of Systems:  No other skin or systemic complaints except as noted in HPI or Assessment and Plan.  Objective  Well appearing patient in no apparent distress; mood and affect are within normal limits.  A full examination was performed including scalp, head, eyes, ears, nose, lips, neck, chest, axillae, abdomen, back, buttocks, bilateral upper extremities, bilateral lower extremities, hands, feet, fingers, toes, fingernails, and toenails. All findings within normal limits unless otherwise noted below.   Relevant physical exam findings are noted in the Assessment and Plan.     right forearm x 1, right hand dorsum x 1 (2) Erythematous stuck-on, waxy papule  Assessment & Plan   SKIN CANCER SCREENING PERFORMED TODAY.  ACTINIC DAMAGE - Chronic condition, secondary to cumulative UV/sun exposure - diffuse scaly erythematous macules with underlying dyspigmentation - Recommend daily broad spectrum sunscreen SPF 30+ to sun-exposed areas, reapply every 2 hours as needed.  - Staying in the shade or wearing long sleeves, sun glasses (UVA+UVB protection) and wide brim hats (4-inch brim around the entire circumference of the hat) are also recommended for sun protection.  - Call for new or changing  lesions.  TELANGIECTASIA Exam: dilated blood vessel at right cheek, blanches  Treatment Plan: Benign appearing on exam Call for changes  Sebaceous Hyperplasia - Small yellow papules with a central dell at face  - Benign-appearing - Observe. Call for changes.   Milia vs Hypertrophic scar at right posterior neck Exam: firm white papule   Treatment Plan: Benign-appearing. Observe   LENTIGINES, SEBORRHEIC KERATOSES, HEMANGIOMAS - Benign normal skin lesions - Benign-appearing - Call for any changes - Sk at left lower leg - Sk 1.3 cm waxy tan patch with adjactent waxy papule at right upper forehead.   MELANOCYTIC NEVI - left lateral abdomen 2 mm medium dark brown macule   - right lower abdomen 4  x 3 mm medium speckled brown  macule    -right lower flank 4 x 2 mm medium dark brown macule  - Tan-brown and/or pink-flesh-colored symmetric macules and papules - Benign appearing on exam today - Observation - Call clinic for new or changing moles - Recommend daily use of broad spectrum spf 30+ sunscreen to sun-exposed areas.   POROKERATOSIS  Right pretibial Exam: 4.5 x 5.0 cm hypopigmented waxy patch with pink tan keratotic rim  see photo today     Treatment Plan: Benign, Observe.  Continue [facility] Cholesterol 2% / Lovastatin 2% Cream bid   Instructions for [facility] Medications  One or more of your medications was sent to the [facility] mail order compounding pharmacy. You will receive an email from them and can purchase the medicine through that link. It will then be mailed to your home at the address you confirmed. If for  any reason you do not receive an email from them, please check your spam folder. If you still do not find the email, please let us  know. [facility] phone number is [phone].    Vascular birthmark Right Upper Arm  Exam : violaceous plaque    Benign, observe.      Seborrheic dermatitis Scalp Exam:  mild pink scaliness at  crown   Chronic condition with duration or expected duration over one year. Currently well-controlled.    Seborrheic Dermatitis  -  is a chronic persistent rash characterized by pinkness and scaling most commonly of the mid face but also can occur on the scalp (dandruff), ears; mid chest, mid back and groin.  It tends to be exacerbated by stress and cooler weather.  People who have neurologic disease may experience new onset or exacerbation of existing seborrheic dermatitis.  The condition is not curable but treatable and can be controlled.   Continue Ketoconazole  2% shampoo apply every other day, massage into scalp and leave in for 3 - 5 minutes before rinsing out   HISTORY OF PRECANCEROUS ACTINIC KERATOSIS - site(s) of PreCancerous Actinic Keratosis clear today at scalp - these may recur and new lesions may form requiring treatment to prevent transformation into skin cancer - observe for new or changing spots and contact [facility] for appointment if occur - photoprotection with sun protective clothing; sunglasses and broad spectrum sunscreen with SPF of at least 30 + and frequent self skin exams recommended - yearly exams by a dermatologist recommended for persons with history of PreCancerous Actinic Keratoses  INFLAMED SEBORRHEIC KERATOSIS (2) right forearm x 1, right hand dorsum x 1 (2) Vs HyAKs  Symptomatic, irritating, patient would like treated. Destruction of lesion - right forearm x 1, right hand dorsum x 1 (2)  Destruction method: cryotherapy   Informed consent: discussed and consent obtained   Lesion destroyed using liquid nitrogen: Yes   Region frozen until ice ball extended beyond lesion: Yes   Outcome: patient tolerated procedure well with no complications   Post-procedure details: wound care instructions given   Additional details:  Prior to procedure, discussed risks of blister formation, small wound, skin dyspigmentation, or rare scar following cryotherapy.  Recommend Vaseline ointment to treated areas while healing.  SEBORRHEIC DERMATITIS   Related Medications ketoconazole  (NIZORAL ) 2 % shampoo apply every other day, massage into scalp and leave in for 3 - 5 minutes before rinsing out Return in about 1 year (around 01/31/2025) for TBSE.  I, Randee Busing, CMA, am acting as scribe for Artemio Larry, MD.   Documentation: I have reviewed the above documentation for accuracy and completeness, and I agree with the above.  Artemio Larry, MD

## 2024-02-02 ENCOUNTER — Ambulatory Visit
Admission: RE | Admit: 2024-02-02 | Discharge: 2024-02-02 | Disposition: A | Discharge status: Home / Self Care | Source: Ambulatory Visit | Attending: Family Medicine | Admitting: Family Medicine

## 2024-02-02 DIAGNOSIS — Z1231 Encounter for screening mammogram for malignant neoplasm of breast: Secondary | ICD-10-CM | POA: Diagnosis not present

## 2024-02-02 DIAGNOSIS — K219 Gastro-esophageal reflux disease without esophagitis: Secondary | ICD-10-CM | POA: Diagnosis not present

## 2024-02-02 DIAGNOSIS — J387 Other diseases of larynx: Secondary | ICD-10-CM | POA: Diagnosis not present

## 2024-02-09 DIAGNOSIS — M0579 Rheumatoid arthritis with rheumatoid factor of multiple sites without organ or systems involvement: Secondary | ICD-10-CM | POA: Diagnosis not present

## 2024-02-09 DIAGNOSIS — M810 Age-related osteoporosis without current pathological fracture: Secondary | ICD-10-CM | POA: Diagnosis not present

## 2024-02-09 DIAGNOSIS — Z79899 Other long term (current) drug therapy: Secondary | ICD-10-CM | POA: Diagnosis not present

## 2024-02-23 ENCOUNTER — Ambulatory Visit: Attending: Unknown Physician Specialty | Admitting: Speech Pathology

## 2024-02-23 DIAGNOSIS — R490 Dysphonia: Secondary | ICD-10-CM | POA: Diagnosis not present

## 2024-02-23 NOTE — Therapy (Unsigned)
 OUTPATIENT SPEECH LANGUAGE PATHOLOGY  VOICE EVALUATION   Patient Name: Deborah Singh MRN: [id number] DOB:1950-09-26, 73 y.o., female Today's Date: 02/23/2024  PCP: Lynwood Null, MD REFERRING PROVIDER: Chinita Hasten, MD (evaluation performed by Almarie Roe, PA)   End of Session - 02/23/24 1145     Visit Number 1    Number of Visits 17    Date for SLP Re-Evaluation 04/19/24    Progress Note Due on Visit 10    SLP Start Time 1145    SLP Stop Time  1220    SLP Time Calculation (min) 35 min    Activity Tolerance Patient tolerated treatment well          Past Medical History:  Diagnosis Date   Actinic keratosis    Anemia    Colon adenomas    De Quervain's tenosynovitis    GERD (gastroesophageal reflux disease)    Headache    Hyperlipidemia    Hypertension    Rheumatoid arthritis (HCC)    Past Surgical History:  Procedure Laterality Date   APPENDECTOMY     COLONOSCOPY     COLONOSCOPY WITH PROPOFOL  N/A 06/17/2017   Procedure: COLONOSCOPY WITH PROPOFOL ;  Surgeon: Viktoria Lamar DASEN, MD;  Location: [facility] ENDOSCOPY;  Service: Endoscopy;  Laterality: N/A;   COLONOSCOPY WITH PROPOFOL  N/A 02/19/2023   Procedure: COLONOSCOPY WITH PROPOFOL ;  Surgeon: Onita Elspeth Sharper, DO;  Location: [facility] ENDOSCOPY;  Service: Gastroenterology;  Laterality: N/A;   ESOPHAGOGASTRODUODENOSCOPY (EGD) WITH PROPOFOL  N/A 02/19/2023   Procedure: ESOPHAGOGASTRODUODENOSCOPY (EGD) WITH PROPOFOL ;  Surgeon: Onita Elspeth Sharper, DO;  Location: [facility] ENDOSCOPY;  Service: Gastroenterology;  Laterality: N/A;   FLEXIBLE SIGMOIDOSCOPY     KNEE ARTHROPLASTY Left 05/24/2021   Procedure: COMPUTER ASSISTED TOTAL KNEE ARTHROPLASTY - RNFA;  Surgeon: Mardee Lynwood SQUIBB, MD;  Location: [facility] ORS;  Service: Orthopedics;  Laterality: Left;   KNEE ARTHROPLASTY Right 09/16/2021   Procedure: COMPUTER ASSISTED TOTAL KNEE ARTHROPLASTY;  Surgeon: Mardee Lynwood SQUIBB, MD;  Location: [facility] ORS;  Service: Orthopedics;  Laterality:  Right;   POLYPECTOMY  02/19/2023   Procedure: POLYPECTOMY;  Surgeon: Onita Elspeth Sharper, DO;  Location: [facility] ENDOSCOPY;  Service: Gastroenterology;;   Patient Active Problem List   Diagnosis Date Noted   Total knee replacement status 09/16/2021   Everitt Curt tenosynovitis 05/24/2021   Dupuytren's contracture of hand 05/24/2021   GERD (gastroesophageal reflux disease) 05/24/2021   Hyperlipidemia 05/24/2021   Hypertension 05/24/2021   Status post total left knee replacement 05/24/2021   Primary osteoarthritis of both knees 03/31/2021   Bursitis of left shoulder 10/01/2015   Rotator cuff tendinitis, left 10/01/2015   Rheumatoid arthritis with rheumatoid factor (HCC) 03/31/2014    ONSET DATE:  couple of years ago,  date of referral 02/16/2024  REFERRING DIAG:  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 She has associated reflux, throat clearing, and dry throat. Pt says this has been going on for a while. After she talks for a certain amount of time, throat gets dry and she gets hoarse.  Laryngoscopy revealed arytenoid edema, and arytenoid erythema, bowing of bilateral vocal cords, consistent  with presbylaryngis.  In addition, pt diagnosed with mild signs of reflux started on 20 mg Omeprazole.   PAIN:  Are you having pain? No   FALLS: Has patient fallen in last 6 months? Yes,   LIVING ENVIRONMENT: Lives with: lives with their spouse Lives in: House/apartment  PLOF: Independent  PATIENT GOALS    to improve voice  OBJECTIVE:   COGNITION: Overall cognitive status: Within functional limits for tasks assessed  SOCIAL HISTORY: Occupation: retired, previously worked in Consulting civil engineer, was a Barrister's clerk Software engineer) Water intake: 3-4 glasses Caffeine/alcohol  intake: a couple of nights a week a glass of wine, 2 cups of tea (maximal) per day Daily voice use: minimal Environmental risks: None reported Occupational risks: None identified Misuse: Strain and Speaks on residual capacity Phonotraumatic behaviors: Excessive and/or habitual throat clearing  PERCEPTUAL VOICE ASSESSMENT: Voice quality: hoarse, breathy, strained, and vocal fatigue Vocal abuse: habitual throat clearing, abnormal breathing pattern, and habitual abnormal pitch Resonance: normal Respiratory function: clavicular breathing and speaking on residual capacity  OBJECTIVE VOICE ASSESSMENT: Sustained ah maximum phonation time: 6.1 seconds Sustained ah loudness average: 77 dB Average fundamental frequency during sustained "ah":215 Hz   (1 SD below average of  244 Hz +/- 27 for gender)  Oral reading (passage) loudness average: 76 dB Oral reading loudness range: 27 dB Conversational pitch average: 243 Hz Highest dynamic pitch in conversational speech: 322 Hz Lowest dynamic pitch in conversational speech: 172 Hz Conversational pitch range: 150 Hz Conversational loudness average: 76 dB Conversational loudness range: 27 dB S/z ratio: .5 (Suggestive of dysfunction >1.0) Voice quality: hoarse, breathy, strained, diplophonia, and vocal fatigue Comments: tremor vocal quality    ORAL MOTOR EXAMINATION Facial : WFL Lingual: WFL Velum: WFL Mandible: WFL Cough: WFL  PATIENT REPORTED OUTCOME MEASURES (PROM): To be completed over the next 3 sessions    TODAY'S TREATMENT:  Skilled education provided on traumatic nature of throat clearing, use of a pectin based throat lozenge, suppression of throat clear (swallow, sip of water), increasing hydration to include an  additional glass of water during the day, consistent use of PPI.    PATIENT EDUCATION: Education details: results of this assessment, ST POC Person educated: Patient Education method: Explanation Education comprehension: needs further education   HOME EXERCISE PROGRAM: Add a glass of water Suppression techniques for throat clear Pectin based throat lozenge   GOALS: Goals reviewed with patient? Yes  SHORT TERM GOALS: Target date: 10 sessions  The patient will be independent for abdominal breathing and breath support exercises.  Baseline: Goal status: INITIAL  2.  The patient will demonstrate independent understanding of vocal hygiene concepts.  Baseline:  Goal status: INITIAL  3.  Patient will report improved communication effectiveness as measured by PROM Baseline:  Goal status: INITIAL  4.  Pt will use abdominal breathing when producing sentences to support phonation in 80% of trials across 3 data sessions.  Baseline:  Goal status: INITIAL   LONG TERM GOALS: Target date: 04/19/2024  The patient will participate in 5-8 minutes conversation, maintaining average loudness of 75 dB and loud, good quality voice with min cues.     Baseline:  Goal status: INITIAL  2.  The patient will increase hydration for an eventual goal of 6-8 glasses per day and limit caffeine intake (to maximum of 1-2, 8 oz cups/day), as measured by patient report.  Baseline:  Goal status: INITIAL   ASSESSMENT:  CLINICAL IMPRESSION: Patient is a 73 y.o. 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 She presents with mild to moderate dysphonia that is c/b hoarse, strained, tremorus, low pitch vocal quality that is further exacerbated by signs of reflux and habitual harsh throat clearing.  .  Pt reports that for the last couple of years, my voice will start breaking up.   OBJECTIVE IMPAIRMENTS include voice disorder. These impairments are limiting patient from effectively  communicating at home and in community. Factors affecting potential to achieve goals and functional outcome are time post onset/reflux. Patient will benefit from skilled SLP services to address above impairments and improve overall function.  REHAB POTENTIAL: Excellent  PLAN: SLP FREQUENCY: 1-2x/week  SLP DURATION: 8 weeks  PLANNED INTERVENTIONS: SLP instruction and feedback, Compensatory strategies, and Patient/family education    Yanitza Shvartsman B. Rubbie, M.S., CCC-SLP, Tree surgeon Certified Brain Injury Specialist [facility]  [facility] Rehabilitation Services Office [phone] Ascom [phone] Fax [phone]

## 2024-02-25 ENCOUNTER — Ambulatory Visit: Admitting: Speech Pathology

## 2024-02-25 DIAGNOSIS — R490 Dysphonia: Secondary | ICD-10-CM | POA: Diagnosis not present

## 2024-02-25 NOTE — Therapy (Signed)
 OUTPATIENT SPEECH LANGUAGE PATHOLOGY  VOICE TREATMENT   Patient Name: Deborah Singh MRN: [id number] DOB:March 29, 1951, 73 y.o., female Today's Date: 02/25/2024  PCP: Lynwood Null, MD REFERRING PROVIDER: Chinita Hasten, MD (evaluation performed by Almarie Roe, PA)   End of Session - 02/25/24 1259     Visit Number 2    Number of Visits 17    Date for SLP Re-Evaluation 04/19/24    Progress Note Due on Visit 10    SLP Start Time 1100    SLP Stop Time  1145    SLP Time Calculation (min) 45 min    Activity Tolerance Patient tolerated treatment well          Past Medical History:  Diagnosis Date   Actinic keratosis    Anemia    Colon adenomas    De Quervain's tenosynovitis    GERD (gastroesophageal reflux disease)    Headache    Hyperlipidemia    Hypertension    Rheumatoid arthritis (HCC)    Past Surgical History:  Procedure Laterality Date   APPENDECTOMY     COLONOSCOPY     COLONOSCOPY WITH PROPOFOL  N/A 06/17/2017   Procedure: COLONOSCOPY WITH PROPOFOL ;  Surgeon: Viktoria Lamar DASEN, MD;  Location: [facility] ENDOSCOPY;  Service: Endoscopy;  Laterality: N/A;   COLONOSCOPY WITH PROPOFOL  N/A 02/19/2023   Procedure: COLONOSCOPY WITH PROPOFOL ;  Surgeon: Onita Elspeth Sharper, DO;  Location: [facility] ENDOSCOPY;  Service: Gastroenterology;  Laterality: N/A;   ESOPHAGOGASTRODUODENOSCOPY (EGD) WITH PROPOFOL  N/A 02/19/2023   Procedure: ESOPHAGOGASTRODUODENOSCOPY (EGD) WITH PROPOFOL ;  Surgeon: Onita Elspeth Sharper, DO;  Location: [facility] ENDOSCOPY;  Service: Gastroenterology;  Laterality: N/A;   FLEXIBLE SIGMOIDOSCOPY     KNEE ARTHROPLASTY Left 05/24/2021   Procedure: COMPUTER ASSISTED TOTAL KNEE ARTHROPLASTY - RNFA;  Surgeon: Mardee Lynwood SQUIBB, MD;  Location: [facility] ORS;  Service: Orthopedics;  Laterality: Left;   KNEE ARTHROPLASTY Right 09/16/2021   Procedure: COMPUTER ASSISTED TOTAL KNEE ARTHROPLASTY;  Surgeon: Mardee Lynwood SQUIBB, MD;  Location: [facility] ORS;  Service: Orthopedics;  Laterality:  Right;   POLYPECTOMY  02/19/2023   Procedure: POLYPECTOMY;  Surgeon: Onita Elspeth Sharper, DO;  Location: [facility] ENDOSCOPY;  Service: Gastroenterology;;   Patient Active Problem List   Diagnosis Date Noted   Total knee replacement status 09/16/2021   Everitt Curt tenosynovitis 05/24/2021   Dupuytren's contracture of hand 05/24/2021   GERD (gastroesophageal reflux disease) 05/24/2021   Hyperlipidemia 05/24/2021   Hypertension 05/24/2021   Status post total left knee replacement 05/24/2021   Primary osteoarthritis of both knees 03/31/2021   Bursitis of left shoulder 10/01/2015   Rotator cuff tendinitis, left 10/01/2015   Rheumatoid arthritis with rheumatoid factor (HCC) 03/31/2014    ONSET DATE:  couple of years ago,  date of referral 02/16/2024  REFERRING DIAG:  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 She has associated reflux, throat clearing, and dry throat. Pt says this has been going on for a while. After she talks for a certain amount of time, throat gets dry and she gets hoarse.  Laryngoscopy revealed arytenoid edema, and arytenoid erythema, bowing of bilateral vocal cords, consistent with presbylaryngis.  In addition, pt diagnosed with mild signs of reflux  started on 20 mg Omeprazole.   PAIN:  Are you having pain? No   FALLS: Has patient fallen in last 6 months? Yes,   LIVING ENVIRONMENT: Lives with: lives with their spouse Lives in: House/apartment  PLOF: Independent  PATIENT GOALS    to improve voice  SUBJECTIVE STATEMENT: Pt pleasant, eager, I have been working on not clearing my throat Pt accompanied by:  self  OBJECTIVE:    SKILLED TREATMENT NOTE: Skilled treatment session focused on pt's dysphonia goals. SLP facilitated session by providing the following interventions:  Skilled verbal and written information (as well as printed handouts from [employer]) were provided: Hydration and Mucus management, Voice and How it Works, Throat Clearing, as well as strategies to reduce reflux.   Pt instructed in abdominal breathing - pt with difficulty isolating movement - instructed to perform in front of a mirror or laying flat on the bed with book on belly button    PATIENT REPORTED OUTCOME MEASURES (PROM): To be completed over the next 3 sessions     PATIENT EDUCATION: Education details: see above Person educated: Patient Education method: Explanation Education comprehension: needs further education   HOME EXERCISE PROGRAM: Add a glass of water Suppression techniques for throat clear Pectin based throat lozenge Abdominal breathing   GOALS: Goals reviewed with patient? Yes  SHORT TERM GOALS: Target date: 10 sessions  The patient will be independent for abdominal breathing and breath support exercises.  Baseline: Goal status: INITIAL  2.  The patient will demonstrate independent understanding of vocal hygiene concepts.  Baseline:  Goal status: INITIAL  3.  Patient will report improved communication effectiveness as measured by PROM Baseline:  Goal status: INITIAL  4.  Pt will use abdominal breathing when producing sentences to support phonation in 80% of trials across 3 data sessions.  Baseline:  Goal status: INITIAL   LONG TERM GOALS: Target date: 04/19/2024  The patient will participate in 5-8 minutes conversation, maintaining average loudness of 75 dB and loud, good quality voice with min cues.     Baseline:  Goal status: INITIAL  2.  The patient will increase hydration for an eventual goal of 6-8 glasses per day and limit caffeine intake (to maximum of 1-2, 8 oz  cups/day), as measured by patient report.  Baseline:  Goal status: INITIAL   ASSESSMENT:  CLINICAL IMPRESSION: Patient is a 73 y.o. female who was seen today for a voice treatment d/t presbylarynx. She presents with mild to moderate dysphonia that is c/b hoarse, strained, tremorus, low pitch vocal quality that is further exacerbated by signs of reflux and habitual harsh throat clearing.  .  Pt very responsive to information and observed actively suppressing throat clears, Great progress. See the above treatment note for details.   OBJECTIVE IMPAIRMENTS include voice disorder. These impairments are limiting patient from effectively communicating at home and in community. Factors affecting potential to achieve goals and functional outcome are time post onset/reflux. Patient will benefit from skilled SLP services to address above impairments and improve overall function.  REHAB POTENTIAL: Excellent  PLAN: SLP FREQUENCY: 1-2x/week  SLP DURATION: 8 weeks  PLANNED INTERVENTIONS: SLP instruction and feedback, Compensatory strategies, and Patient/family education    Veneta Sliter B. Rubbie, M.S., CCC-SLP, Tree surgeon Certified Brain Injury Specialist [facility]  [facility] Rehabilitation Services Office [phone] Ascom [phone] Fax [phone]

## 2024-02-29 ENCOUNTER — Ambulatory Visit: Admitting: Speech Pathology

## 2024-02-29 DIAGNOSIS — R490 Dysphonia: Secondary | ICD-10-CM | POA: Diagnosis not present

## 2024-02-29 NOTE — Therapy (Signed)
 OUTPATIENT SPEECH LANGUAGE PATHOLOGY  VOICE TREATMENT   Patient Name: Deborah Singh MRN: [id number] DOB:[birth year], 73 y.o., female Today's Date: 02/29/2024  PCP: Lynwood Null, MD REFERRING PROVIDER: Chinita Hasten, MD (evaluation performed by Almarie Roe, PA)   End of Session - 02/29/24 1313     Visit Number 3    Number of Visits 17    Date for SLP Re-Evaluation 04/19/24    Progress Note Due on Visit 10    SLP Start Time 1315    SLP Stop Time  1400    SLP Time Calculation (min) 45 min    Activity Tolerance Patient tolerated treatment well          Past Medical History:  Diagnosis Date   Actinic keratosis    Anemia    Colon adenomas    De Quervain's tenosynovitis    GERD (gastroesophageal reflux disease)    Headache    Hyperlipidemia    Hypertension    Rheumatoid arthritis (HCC)    Past Surgical History:  Procedure Laterality Date   APPENDECTOMY     COLONOSCOPY     COLONOSCOPY WITH PROPOFOL  N/A 06/17/2017   Procedure: COLONOSCOPY WITH PROPOFOL ;  Surgeon: Viktoria Lamar DASEN, MD;  Location: [facility] ENDOSCOPY;  Service: Endoscopy;  Laterality: N/A;   COLONOSCOPY WITH PROPOFOL  N/A 02/19/2023   Procedure: COLONOSCOPY WITH PROPOFOL ;  Surgeon: Onita Elspeth Sharper, DO;  Location: [facility] ENDOSCOPY;  Service: Gastroenterology;  Laterality: N/A;   ESOPHAGOGASTRODUODENOSCOPY (EGD) WITH PROPOFOL  N/A 02/19/2023   Procedure: ESOPHAGOGASTRODUODENOSCOPY (EGD) WITH PROPOFOL ;  Surgeon: Onita Elspeth Sharper, DO;  Location: [facility] ENDOSCOPY;  Service: Gastroenterology;  Laterality: N/A;   FLEXIBLE SIGMOIDOSCOPY     KNEE ARTHROPLASTY Left 05/24/2021   Procedure: COMPUTER ASSISTED TOTAL KNEE ARTHROPLASTY - RNFA;  Surgeon: Mardee Lynwood SQUIBB, MD;  Location: [facility] ORS;  Service: Orthopedics;  Laterality: Left;   KNEE ARTHROPLASTY Right 09/16/2021   Procedure: COMPUTER ASSISTED TOTAL KNEE ARTHROPLASTY;  Surgeon: Mardee Lynwood SQUIBB, MD;  Location: [facility] ORS;  Service: Orthopedics;  Laterality:  Right;   POLYPECTOMY  02/19/2023   Procedure: POLYPECTOMY;  Surgeon: Onita Elspeth Sharper, DO;  Location: [facility] ENDOSCOPY;  Service: Gastroenterology;;   Patient Active Problem List   Diagnosis Date Noted   Total knee replacement status 09/16/2021   Everitt Curt tenosynovitis 05/24/2021   Dupuytren's contracture of hand 05/24/2021   GERD (gastroesophageal reflux disease) 05/24/2021   Hyperlipidemia 05/24/2021   Hypertension 05/24/2021   Status post total left knee replacement 05/24/2021   Primary osteoarthritis of both knees 03/31/2021   Bursitis of left shoulder 10/01/2015   Rotator cuff tendinitis, left 10/01/2015   Rheumatoid arthritis with rheumatoid factor (HCC) 03/31/2014    ONSET DATE:  couple of years ago,  date of referral 02/16/2024  REFERRING DIAG:  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 She has associated reflux, throat clearing, and dry throat. Pt says this has been going on for a while. After she talks for a certain amount of time, throat gets dry and she gets hoarse.  Laryngoscopy revealed arytenoid edema, and arytenoid erythema, bowing of bilateral vocal cords, consistent with presbylaryngis.  In addition, pt diagnosed with mild signs of reflux  started on 20 mg Omeprazole.   PAIN:  Are you having pain? No   FALLS: Has patient fallen in last 6 months? Yes,   LIVING ENVIRONMENT: Lives with: lives with their spouse Lives in: House/apartment  PLOF: Independent  PATIENT GOALS    to improve voice  SUBJECTIVE STATEMENT: Pt pleasant, eager, and some questions regarding potential causes of presbylarynx Pt  accompanied by: self  OBJECTIVE:    SKILLED TREATMENT NOTE: Skilled treatment session focused on pt's dysphonia goals. SLP facilitated session by providing the following interventions:  Pt arrived with engaging questions related to information that she was provided in previous session.  Pt was Mod I for abdominal breathing and was able to produce pulsing s as well as voice building exercises. (Vowels in isolation progressing to DOW, numbers)    PATIENT REPORTED OUTCOME MEASURES (PROM): To be completed over the next 3 sessions     PATIENT EDUCATION: Education details: see above Person educated: Patient Education method: Explanation Education comprehension: needs further education   HOME EXERCISE PROGRAM: Add a glass of water Suppression techniques for throat clear Pectin based throat lozenge Abdominal breathing   GOALS: Goals reviewed with patient? Yes  SHORT TERM GOALS: Target date: 10 sessions  The patient will be independent for abdominal breathing and breath support exercises.  Baseline: Goal status: INITIAL  2.  The patient will demonstrate independent understanding of vocal hygiene concepts.  Baseline:  Goal status: INITIAL  3.  Patient will report improved communication effectiveness as measured by PROM Baseline:  Goal status: INITIAL  4.  Pt will use abdominal breathing when producing sentences to support phonation in 80% of trials across 3 data sessions.  Baseline:  Goal status: INITIAL   LONG TERM GOALS: Target date: 04/19/2024  The patient will participate in 5-8 minutes conversation, maintaining average loudness of 75 dB and loud, good quality voice with min cues.     Baseline:  Goal status: INITIAL  2.  The patient will increase hydration for an eventual goal of 6-8 glasses per day and limit caffeine intake (to maximum of 1-2, 8 oz cups/day), as measured by patient report.  Baseline:  Goal status: INITIAL   ASSESSMENT:  CLINICAL  IMPRESSION: Patient is a 73 y.o. female who was seen today for a voice treatment d/t presbylarynx. She presents with mild to moderate dysphonia that is c/b hoarse, strained, tremorus, low pitch vocal quality that is further exacerbated by signs of reflux and habitual harsh throat clearing.  .  Pt very responsive to information and observed actively suppressing throat clears, Great progress. See the above treatment note for details.   OBJECTIVE IMPAIRMENTS include voice disorder. These impairments are limiting patient from effectively communicating at home and in community. Factors affecting potential to achieve goals and functional outcome are time post onset/reflux. Patient will benefit from skilled SLP services to address above impairments and improve overall function.  REHAB POTENTIAL: Excellent  PLAN: SLP FREQUENCY: 1-2x/week  SLP DURATION: 8 weeks  PLANNED INTERVENTIONS: SLP instruction and feedback, Compensatory strategies, and Patient/family education    Windsor Zirkelbach B. Rubbie, M.S., CCC-SLP, Tree surgeon Certified Brain Injury Specialist [facility]  [facility] Rehabilitation Services Office [phone] Ascom [phone] Fax [phone]

## 2024-03-02 ENCOUNTER — Ambulatory Visit: Attending: Unknown Physician Specialty | Admitting: Speech Pathology

## 2024-03-02 DIAGNOSIS — R490 Dysphonia: Secondary | ICD-10-CM | POA: Diagnosis not present

## 2024-03-02 NOTE — Therapy (Unsigned)
 OUTPATIENT SPEECH LANGUAGE PATHOLOGY  VOICE TREATMENT   Patient Name: ELYSSE Singh MRN: [id number] DOB:May 07, 1951, 73 y.o., female Today's Date: 03/02/2024  PCP: Deborah Null, MD REFERRING PROVIDER: Chinita Hasten, MD (evaluation performed by Deborah Roe, PA)   End of Session - 03/02/24 1251     Visit Number 4    Number of Visits 17    Date for SLP Re-Evaluation 04/19/24    Progress Note Due on Visit 10    SLP Start Time 1145    SLP Stop Time  1230    SLP Time Calculation (min) 45 min    Activity Tolerance Patient tolerated treatment well          Past Medical History:  Diagnosis Date   Actinic keratosis    Anemia    Colon adenomas    De Quervain's tenosynovitis    GERD (gastroesophageal reflux disease)    Headache    Hyperlipidemia    Hypertension    Rheumatoid arthritis (HCC)    Past Surgical History:  Procedure Laterality Date   APPENDECTOMY     COLONOSCOPY     COLONOSCOPY WITH PROPOFOL  N/A 06/17/2017   Procedure: COLONOSCOPY WITH PROPOFOL ;  Surgeon: Deborah Lamar DASEN, MD;  Location: [facility] ENDOSCOPY;  Service: Endoscopy;  Laterality: N/A;   COLONOSCOPY WITH PROPOFOL  N/A 02/19/2023   Procedure: COLONOSCOPY WITH PROPOFOL ;  Surgeon: Deborah Elspeth Sharper, DO;  Location: [facility] ENDOSCOPY;  Service: Gastroenterology;  Laterality: N/A;   ESOPHAGOGASTRODUODENOSCOPY (EGD) WITH PROPOFOL  N/A 02/19/2023   Procedure: ESOPHAGOGASTRODUODENOSCOPY (EGD) WITH PROPOFOL ;  Surgeon: Deborah Elspeth Sharper, DO;  Location: [facility] ENDOSCOPY;  Service: Gastroenterology;  Laterality: N/A;   FLEXIBLE SIGMOIDOSCOPY     KNEE ARTHROPLASTY Left 05/24/2021   Procedure: COMPUTER ASSISTED TOTAL KNEE ARTHROPLASTY - RNFA;  Surgeon: Deborah Deborah SQUIBB, MD;  Location: [facility] ORS;  Service: Orthopedics;  Laterality: Left;   KNEE ARTHROPLASTY Right 09/16/2021   Procedure: COMPUTER ASSISTED TOTAL KNEE ARTHROPLASTY;  Surgeon: Deborah Deborah SQUIBB, MD;  Location: [facility] ORS;  Service: Orthopedics;  Laterality:  Right;   POLYPECTOMY  02/19/2023   Procedure: POLYPECTOMY;  Surgeon: Deborah Elspeth Sharper, DO;  Location: [facility] ENDOSCOPY;  Service: Gastroenterology;;   Patient Active Problem List   Diagnosis Date Noted   Total knee replacement status 09/16/2021   Deborah Singh tenosynovitis 05/24/2021   Dupuytren's contracture of hand 05/24/2021   GERD (gastroesophageal reflux disease) 05/24/2021   Hyperlipidemia 05/24/2021   Hypertension 05/24/2021   Status post total left knee replacement 05/24/2021   Primary osteoarthritis of both knees 03/31/2021   Bursitis of left shoulder 10/01/2015   Rotator cuff tendinitis, left 10/01/2015   Rheumatoid arthritis with rheumatoid factor (HCC) 03/31/2014    ONSET DATE:  couple of years ago,  date of referral 02/16/2024  REFERRING DIAG:  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 She has associated reflux, throat clearing, and dry throat. Pt says this has been going on for a while. After she talks for a certain amount of time, throat gets dry and she gets hoarse.  Laryngoscopy revealed arytenoid edema, and arytenoid erythema, bowing of bilateral vocal cords, consistent with presbylaryngis.  In addition, pt diagnosed with mild signs of reflux  started on 20 mg Omeprazole.   PAIN:  Are you having pain? No   FALLS: Has patient fallen in last 6 months? Yes,   LIVING ENVIRONMENT: Lives with: lives with their spouse Lives in: House/apartment  PLOF: Independent  PATIENT GOALS    to improve voice  SUBJECTIVE STATEMENT: Pt pleasant, eager, she reports subjective improvement in my throat Pt accompanied by:  self  OBJECTIVE:    SKILLED TREATMENT NOTE: Skilled treatment session focused on pt's dysphonia goals. SLP facilitated session by providing the following interventions:  Pt was Mod I for abdominal breathing and was able to produce pulsing s as well as voice building exercises.   Pt able to independently complete abdominal breathing and voice building exercises from producing moderately loud and clear vowels in isolation progressing to functional sentences. Pt benefited from moderate cues to not over analysis and repeat phrases in an unproductive attempt to fi the . Education provided on decreased respiratory support at the end of 2 out of 19 sentences with pt able to improve by focusing on abdominal breathing.   Pt also presents with much improved suppression of throat clearing with 1 subtle throat clear observed which pt caught and sipped on water.    PATIENT REPORTED OUTCOME MEASURES (PROM): To be completed over the next 3 sessions     PATIENT EDUCATION: Education details: see above Person educated: Patient Education method: Explanation Education comprehension: needs further education   HOME EXERCISE PROGRAM: Add a glass of water Suppression techniques for throat clear Pectin based throat lozenge Abdominal breathing   GOALS: Goals reviewed with patient? Yes  SHORT TERM GOALS: Target date: 10 sessions  The patient will be independent for abdominal breathing and breath support exercises.  Baseline: Goal status: INITIAL  2.  The patient will demonstrate independent understanding of vocal hygiene concepts.  Baseline:  Goal status: INITIAL  3.  Patient will report improved communication effectiveness as measured by PROM Baseline:  Goal status: INITIAL  4.  Pt will use abdominal breathing when producing sentences to support phonation in 80% of trials across 3 data sessions.  Baseline:  Goal status: INITIAL   LONG TERM GOALS: Target date: 04/19/2024  The  patient will participate in 5-8 minutes conversation, maintaining average loudness of 75 dB and loud, good quality voice with min cues.     Baseline:  Goal status: INITIAL  2.  The patient will increase hydration for an eventual goal of 6-8 glasses per day and limit caffeine intake (to maximum of 1-2, 8 oz cups/day), as measured by patient report.  Baseline:  Goal status: INITIAL   ASSESSMENT:  CLINICAL IMPRESSION: Patient is a 73 y.o. female who was seen today for a voice treatment d/t presbylarynx. She presents with mild to moderate dysphonia that is c/b hoarse, strained, tremorus, low pitch vocal quality that is further exacerbated by signs of reflux and habitual harsh throat clearing.  .  Pt very responsive to information and observed actively suppressing throat clears, Great progress. See the above treatment note for details.   OBJECTIVE IMPAIRMENTS include voice disorder. These impairments are limiting patient from effectively communicating at home and in community. Factors affecting potential to achieve goals and functional outcome are time post onset/reflux. Patient will benefit from skilled SLP services to address above impairments and improve overall function.  REHAB POTENTIAL: Excellent  PLAN: SLP FREQUENCY: 1-2x/week  SLP DURATION: 8 weeks  PLANNED INTERVENTIONS: SLP instruction and feedback, Compensatory strategies, and Patient/family education    Kashvi Prevette B.  Rubbie, M.S., CCC-SLP, Tree surgeon Certified Brain Injury Specialist [facility]  [facility] Rehabilitation Services Office [phone] Ascom [phone] Fax [phone]

## 2024-03-08 ENCOUNTER — Ambulatory Visit: Admitting: Speech Pathology

## 2024-03-08 DIAGNOSIS — R490 Dysphonia: Secondary | ICD-10-CM

## 2024-03-08 NOTE — Therapy (Signed)
 OUTPATIENT SPEECH LANGUAGE PATHOLOGY  VOICE TREATMENT   Patient Name: Deborah Singh MRN: [id number] DOB:[birth year], 73 y.o., female Today's Date: 03/08/2024  PCP: Lynwood Null, MD REFERRING PROVIDER: Chinita Hasten, MD (evaluation performed by Almarie Roe, PA)   End of Session - 03/08/24 1059     Visit Number 5    Number of Visits 17    Date for SLP Re-Evaluation 04/19/24    Progress Note Due on Visit 10    SLP Start Time 1100    SLP Stop Time  1145    SLP Time Calculation (min) 45 min    Activity Tolerance Patient tolerated treatment well          Past Medical History:  Diagnosis Date   Actinic keratosis    Anemia    Colon adenomas    De Quervain's tenosynovitis    GERD (gastroesophageal reflux disease)    Headache    Hyperlipidemia    Hypertension    Rheumatoid arthritis (HCC)    Past Surgical History:  Procedure Laterality Date   APPENDECTOMY     COLONOSCOPY     COLONOSCOPY WITH PROPOFOL  N/A 06/17/2017   Procedure: COLONOSCOPY WITH PROPOFOL ;  Surgeon: Viktoria Lamar DASEN, MD;  Location: [facility] ENDOSCOPY;  Service: Endoscopy;  Laterality: N/A;   COLONOSCOPY WITH PROPOFOL  N/A 02/19/2023   Procedure: COLONOSCOPY WITH PROPOFOL ;  Surgeon: Onita Elspeth Sharper, DO;  Location: [facility] ENDOSCOPY;  Service: Gastroenterology;  Laterality: N/A;   ESOPHAGOGASTRODUODENOSCOPY (EGD) WITH PROPOFOL  N/A 02/19/2023   Procedure: ESOPHAGOGASTRODUODENOSCOPY (EGD) WITH PROPOFOL ;  Surgeon: Onita Elspeth Sharper, DO;  Location: [facility] ENDOSCOPY;  Service: Gastroenterology;  Laterality: N/A;   FLEXIBLE SIGMOIDOSCOPY     KNEE ARTHROPLASTY Left 05/24/2021   Procedure: COMPUTER ASSISTED TOTAL KNEE ARTHROPLASTY - RNFA;  Surgeon: Mardee Lynwood SQUIBB, MD;  Location: [facility] ORS;  Service: Orthopedics;  Laterality: Left;   KNEE ARTHROPLASTY Right 09/16/2021   Procedure: COMPUTER ASSISTED TOTAL KNEE ARTHROPLASTY;  Surgeon: Mardee Lynwood SQUIBB, MD;  Location: [facility] ORS;  Service: Orthopedics;  Laterality:  Right;   POLYPECTOMY  02/19/2023   Procedure: POLYPECTOMY;  Surgeon: Onita Elspeth Sharper, DO;  Location: [facility] ENDOSCOPY;  Service: Gastroenterology;;   Patient Active Problem List   Diagnosis Date Noted   Total knee replacement status 09/16/2021   Everitt Curt tenosynovitis 05/24/2021   Dupuytren's contracture of hand 05/24/2021   GERD (gastroesophageal reflux disease) 05/24/2021   Hyperlipidemia 05/24/2021   Hypertension 05/24/2021   Status post total left knee replacement 05/24/2021   Primary osteoarthritis of both knees 03/31/2021   Bursitis of left shoulder 10/01/2015   Rotator cuff tendinitis, left 10/01/2015   Rheumatoid arthritis with rheumatoid factor (HCC) 03/31/2014    ONSET DATE:  couple of years ago,  date of referral 02/16/2024  REFERRING DIAG:  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 She has associated reflux, throat clearing, and dry throat. Pt says this has been going on for a while. After she talks for a certain amount of time, throat gets dry and she gets hoarse.  Laryngoscopy revealed arytenoid edema, and arytenoid erythema, bowing of bilateral vocal cords, consistent with presbylaryngis.  In addition, pt diagnosed with mild signs of reflux  started on 20 mg Omeprazole.   PAIN:  Are you having pain? No   FALLS: Has patient fallen in last 6 months? Yes,   LIVING ENVIRONMENT: Lives with: lives with their spouse Lives in: House/apartment  PLOF: Independent  PATIENT GOALS    to improve voice  SUBJECTIVE STATEMENT: Pt pleasant, eager, Pt accompanied by: self  OBJECTIVE:    SKILLED TREATMENT  NOTE: Skilled treatment session focused on pt's dysphonia goals. SLP facilitated session by providing the following interventions:  I think the main thing that is helping my voice is not clearing my throat Pt reports that my voice held out fine over the weekend when out of town with family.   Pt able to independently complete voice building exercises from producing moderately loud and clear vowels in isolation progressing to conversation. Pt also able to produce glides (low to high; high to low)  Pt also presents with much improved suppression of throat clearing with 1 subtle throat clear observed which pt caught and sipped on water.     PATIENT EDUCATION: Education details: see above Person educated: Patient Education method: Explanation Education comprehension: needs further education   HOME EXERCISE PROGRAM: Add a glass of water Suppression techniques for throat clear Pectin based throat lozenge Abdominal breathing   GOALS: Goals reviewed with patient? Yes  SHORT TERM GOALS: Target date: 10 sessions  The patient will be independent for abdominal breathing and breath support exercises.  Baseline: Goal status: INITIAL  2.  The patient will demonstrate independent understanding of vocal hygiene concepts.  Baseline:  Goal status: INITIAL  3.  Patient will report improved communication effectiveness as measured by PROM Baseline:  Goal status: INITIAL  4.  Pt will use abdominal breathing when producing sentences to support phonation in 80% of trials across 3 data sessions.  Baseline:  Goal status: INITIAL   LONG TERM GOALS: Target date: 04/19/2024  The patient will participate in 5-8 minutes conversation, maintaining average loudness of 75 dB and loud, good quality voice with min cues.     Baseline:  Goal status: INITIAL  2.  The patient will increase hydration for an eventual goal of 6-8 glasses per day and limit caffeine intake (to maximum of 1-2, 8 oz  cups/day), as measured by patient report.  Baseline:  Goal status: INITIAL   ASSESSMENT:  CLINICAL IMPRESSION: Patient is a 73 y.o. female who was seen today for a voice treatment d/t presbylarynx. She presents with mild to moderate dysphonia that is c/b hoarse, strained, tremorus, low pitch vocal quality that is further exacerbated by signs of reflux and habitual harsh throat clearing.  .  Continued eagerness and great progress.  See the above treatment note for details.   OBJECTIVE IMPAIRMENTS include voice disorder. These impairments are limiting patient from effectively communicating at home and in community. Factors affecting potential to achieve goals and functional outcome are time post onset/reflux. Patient will benefit from skilled SLP services to address above impairments and improve overall function.  REHAB POTENTIAL: Excellent  PLAN: SLP FREQUENCY: 1-2x/week  SLP DURATION: 8 weeks  PLANNED INTERVENTIONS: SLP instruction and feedback, Compensatory strategies, and Patient/family education    Meganne Rita B. Rubbie, M.S., CCC-SLP, Tree surgeon Certified Brain Injury Specialist [facility]  [facility] Rehabilitation Services Office [phone] Ascom [phone] Fax [phone]

## 2024-03-10 ENCOUNTER — Ambulatory Visit: Admitting: Speech Pathology

## 2024-03-10 DIAGNOSIS — R490 Dysphonia: Secondary | ICD-10-CM

## 2024-03-10 NOTE — Therapy (Signed)
 OUTPATIENT SPEECH LANGUAGE PATHOLOGY  VOICE TREATMENT   Patient Name: Deborah Singh MRN: [id number] DOB:[birth year], 73 y.o., female Today's Date: 03/10/2024  PCP: Lynwood Null, MD REFERRING PROVIDER: Chinita Hasten, MD (evaluation performed by Almarie Roe, PA)   End of Session - 03/10/24 1146     Visit Number 6    Number of Visits 17    Date for SLP Re-Evaluation 04/19/24    Progress Note Due on Visit 10    SLP Start Time 1145    SLP Stop Time  1220    SLP Time Calculation (min) 35 min    Activity Tolerance Patient tolerated treatment well          Past Medical History:  Diagnosis Date   Actinic keratosis    Anemia    Colon adenomas    De Quervain's tenosynovitis    GERD (gastroesophageal reflux disease)    Headache    Hyperlipidemia    Hypertension    Rheumatoid arthritis (HCC)    Past Surgical History:  Procedure Laterality Date   APPENDECTOMY     COLONOSCOPY     COLONOSCOPY WITH PROPOFOL  N/A 06/17/2017   Procedure: COLONOSCOPY WITH PROPOFOL ;  Surgeon: Viktoria Lamar DASEN, MD;  Location: [facility] ENDOSCOPY;  Service: Endoscopy;  Laterality: N/A;   COLONOSCOPY WITH PROPOFOL  N/A 02/19/2023   Procedure: COLONOSCOPY WITH PROPOFOL ;  Surgeon: Onita Elspeth Sharper, DO;  Location: [facility] ENDOSCOPY;  Service: Gastroenterology;  Laterality: N/A;   ESOPHAGOGASTRODUODENOSCOPY (EGD) WITH PROPOFOL  N/A 02/19/2023   Procedure: ESOPHAGOGASTRODUODENOSCOPY (EGD) WITH PROPOFOL ;  Surgeon: Onita Elspeth Sharper, DO;  Location: [facility] ENDOSCOPY;  Service: Gastroenterology;  Laterality: N/A;   FLEXIBLE SIGMOIDOSCOPY     KNEE ARTHROPLASTY Left 05/24/2021   Procedure: COMPUTER ASSISTED TOTAL KNEE ARTHROPLASTY - RNFA;  Surgeon: Mardee Lynwood SQUIBB, MD;  Location: [facility] ORS;  Service: Orthopedics;  Laterality: Left;   KNEE ARTHROPLASTY Right 09/16/2021   Procedure: COMPUTER ASSISTED TOTAL KNEE ARTHROPLASTY;  Surgeon: Mardee Lynwood SQUIBB, MD;  Location: [facility] ORS;  Service: Orthopedics;  Laterality:  Right;   POLYPECTOMY  02/19/2023   Procedure: POLYPECTOMY;  Surgeon: Onita Elspeth Sharper, DO;  Location: [facility] ENDOSCOPY;  Service: Gastroenterology;;   Patient Active Problem List   Diagnosis Date Noted   Total knee replacement status 09/16/2021   Everitt Curt tenosynovitis 05/24/2021   Dupuytren's contracture of hand 05/24/2021   GERD (gastroesophageal reflux disease) 05/24/2021   Hyperlipidemia 05/24/2021   Hypertension 05/24/2021   Status post total left knee replacement 05/24/2021   Primary osteoarthritis of both knees 03/31/2021   Bursitis of left shoulder 10/01/2015   Rotator cuff tendinitis, left 10/01/2015   Rheumatoid arthritis with rheumatoid factor (HCC) 03/31/2014    ONSET DATE:  couple of years ago,  date of referral 02/16/2024  REFERRING DIAG:  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 She has associated reflux, throat clearing, and dry throat. Pt says this has been going on for a while. After she talks for a certain amount of time, throat gets dry and she gets hoarse.  Laryngoscopy revealed arytenoid edema, and arytenoid erythema, bowing of bilateral vocal cords, consistent with presbylaryngis.  In addition, pt diagnosed with mild signs of reflux  started on 20 mg Omeprazole.   PAIN:  Are you having pain? No   FALLS: Has patient fallen in last 6 months? Yes,   LIVING ENVIRONMENT: Lives with: lives with their spouse Lives in: House/apartment  PLOF: Independent  PATIENT GOALS    to improve voice  SUBJECTIVE STATEMENT: Pt pleasant, eager, Pt accompanied by: self  OBJECTIVE:    SKILLED TREATMENT  NOTE: Skilled treatment session focused on pt's dysphonia goals. SLP facilitated session by providing the following interventions:  Pt reports continued improvement in functional voice use  Pt able to independently complete voice building exercises from producing moderately loud and clear vowels in isolation progressing to conversation. Pt also able to produce glides (low to high; high to low).   Pt also engaged in conversation with increased cognitive load given and continued good vocal intensity and quality.    PATIENT EDUCATION: Education details: see above Person educated: Patient Education method: Explanation Education comprehension: needs further education   HOME EXERCISE PROGRAM: Add a glass of water Suppression techniques for throat clear Pectin based throat lozenge Abdominal breathing   GOALS: Goals reviewed with patient? Yes  SHORT TERM GOALS: Target date: 10 sessions  The patient will be independent for abdominal breathing and breath support exercises.  Baseline: Goal status: INITIAL  2.  The patient will demonstrate independent understanding of vocal hygiene concepts.  Baseline:  Goal status: INITIAL  3.  Patient will report improved communication effectiveness as measured by PROM Baseline:  Goal status: INITIAL  4.  Pt will use abdominal breathing when producing sentences to support phonation in 80% of trials across 3 data sessions.  Baseline:  Goal status: INITIAL   LONG TERM GOALS: Target date: 04/19/2024  The patient will participate in 5-8 minutes conversation, maintaining average loudness of 75 dB and loud, good quality voice with min cues.     Baseline:  Goal status: INITIAL  2.  The patient will increase hydration for an eventual goal of 6-8 glasses per day and limit caffeine intake (to maximum of 1-2, 8 oz cups/day), as measured by patient report.  Baseline:  Goal status: INITIAL   ASSESSMENT:  CLINICAL IMPRESSION: Patient is a 73 y.o.  female who was seen today for a voice treatment d/t presbylarynx. She presents with mild to moderate dysphonia that is c/b hoarse, strained, tremorus, low pitch vocal quality that is further exacerbated by signs of reflux and habitual harsh throat clearing.  .  Continued eagerness and great progress.  See the above treatment note for details.   OBJECTIVE IMPAIRMENTS include voice disorder. These impairments are limiting patient from effectively communicating at home and in community. Factors affecting potential to achieve goals and functional outcome are time post onset/reflux. Patient will benefit from skilled SLP services to address above impairments and improve overall function.  REHAB POTENTIAL: Excellent  PLAN: SLP FREQUENCY: 1-2x/week  SLP DURATION: 8 weeks  PLANNED INTERVENTIONS: SLP instruction and feedback, Compensatory strategies, and Patient/family education    Tyanne Derocher B. Rubbie, M.S., CCC-SLP, Tree surgeon Certified Brain Injury Specialist [facility]  [facility] Rehabilitation Services Office [phone] Ascom [phone] Fax [phone]

## 2024-03-14 ENCOUNTER — Ambulatory Visit: Admitting: Speech Pathology

## 2024-03-14 DIAGNOSIS — R490 Dysphonia: Secondary | ICD-10-CM

## 2024-03-14 NOTE — Therapy (Signed)
 OUTPATIENT SPEECH LANGUAGE PATHOLOGY  VOICE TREATMENT   Patient Name: Deborah Singh MRN: [id number] DOB:06/28/1951, 73 y.o., female Today's Date: 03/14/2024  PCP: Lynwood Null, MD REFERRING PROVIDER: Chinita Hasten, MD (evaluation performed by Almarie Roe, PA)   End of Session - 03/14/24 1118     Visit Number 7    Number of Visits 17    Date for SLP Re-Evaluation 04/19/24    Progress Note Due on Visit 10    SLP Start Time 1100    SLP Stop Time  1145    SLP Time Calculation (min) 45 min    Activity Tolerance Patient tolerated treatment well          Past Medical History:  Diagnosis Date   Actinic keratosis    Anemia    Colon adenomas    De Quervain's tenosynovitis    GERD (gastroesophageal reflux disease)    Headache    Hyperlipidemia    Hypertension    Rheumatoid arthritis (HCC)    Past Surgical History:  Procedure Laterality Date   APPENDECTOMY     COLONOSCOPY     COLONOSCOPY WITH PROPOFOL  N/A 06/17/2017   Procedure: COLONOSCOPY WITH PROPOFOL ;  Surgeon: Viktoria Lamar DASEN, MD;  Location: [facility] ENDOSCOPY;  Service: Endoscopy;  Laterality: N/A;   COLONOSCOPY WITH PROPOFOL  N/A 02/19/2023   Procedure: COLONOSCOPY WITH PROPOFOL ;  Surgeon: Onita Elspeth Sharper, DO;  Location: [facility] ENDOSCOPY;  Service: Gastroenterology;  Laterality: N/A;   ESOPHAGOGASTRODUODENOSCOPY (EGD) WITH PROPOFOL  N/A 02/19/2023   Procedure: ESOPHAGOGASTRODUODENOSCOPY (EGD) WITH PROPOFOL ;  Surgeon: Onita Elspeth Sharper, DO;  Location: [facility] ENDOSCOPY;  Service: Gastroenterology;  Laterality: N/A;   FLEXIBLE SIGMOIDOSCOPY     KNEE ARTHROPLASTY Left 05/24/2021   Procedure: COMPUTER ASSISTED TOTAL KNEE ARTHROPLASTY - RNFA;  Surgeon: Mardee Lynwood SQUIBB, MD;  Location: [facility] ORS;  Service: Orthopedics;  Laterality: Left;   KNEE ARTHROPLASTY Right 09/16/2021   Procedure: COMPUTER ASSISTED TOTAL KNEE ARTHROPLASTY;  Surgeon: Mardee Lynwood SQUIBB, MD;  Location: [facility] ORS;  Service: Orthopedics;  Laterality:  Right;   POLYPECTOMY  02/19/2023   Procedure: POLYPECTOMY;  Surgeon: Onita Elspeth Sharper, DO;  Location: [facility] ENDOSCOPY;  Service: Gastroenterology;;   Patient Active Problem List   Diagnosis Date Noted   Total knee replacement status 09/16/2021   Everitt Curt tenosynovitis 05/24/2021   Dupuytren's contracture of hand 05/24/2021   GERD (gastroesophageal reflux disease) 05/24/2021   Hyperlipidemia 05/24/2021   Hypertension 05/24/2021   Status post total left knee replacement 05/24/2021   Primary osteoarthritis of both knees 03/31/2021   Bursitis of left shoulder 10/01/2015   Rotator cuff tendinitis, left 10/01/2015   Rheumatoid arthritis with rheumatoid factor (HCC) 03/31/2014    ONSET DATE:  couple of years ago,  date of referral 02/16/2024  REFERRING DIAG:  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 She has associated reflux, throat clearing, and dry throat. Pt says this has been going on for a while. After she talks for a certain amount of time, throat gets dry and she gets hoarse.  Laryngoscopy revealed arytenoid edema, and arytenoid erythema, bowing of bilateral vocal cords, consistent with presbylaryngis.  In addition, pt diagnosed with mild signs of reflux  started on 20 mg Omeprazole.   PAIN:  Are you having pain? No   FALLS: Has patient fallen in last 6 months? Yes,   LIVING ENVIRONMENT: Lives with: lives with their spouse Lives in: House/apartment  PLOF: Independent  PATIENT GOALS    to improve voice  SUBJECTIVE STATEMENT: Pt pleasant, eager, Pt accompanied by: self  OBJECTIVE:    SKILLED TREATMENT  NOTE: Skilled treatment session focused on pt's dysphonia goals. SLP facilitated session by providing the following interventions:  Pt reports continued improvement in functional voice use I can talk longer before I need a sip of water  Introduced list of potential vocal warm-up exercises to complete prior to engaging in activities of prolonged communication such as Bible Study, eating supper with friends or phone conversation.   Pt able to independently complete voice building exercises from producing moderately loud and clear vowels in isolation progressing to conversation. Pt also able to produce glides (low to high; high to low or gliding with cords).   Pt also engaged in conversation with increased cognitive load given and continued good vocal intensity and quality.    PATIENT EDUCATION: Education details: see above Person educated: Patient Education method: Explanation Education comprehension: needs further education   HOME EXERCISE PROGRAM: Add a glass of water Suppression techniques for throat clear Pectin based throat lozenge Abdominal breathing   GOALS: Goals reviewed with patient? Yes  SHORT TERM GOALS: Target date: 10 sessions  The patient will be independent for abdominal breathing and breath support exercises.  Baseline: Goal status: INITIAL  2.  The patient will demonstrate independent understanding of vocal hygiene concepts.  Baseline:  Goal status: INITIAL  3.  Patient will report improved communication effectiveness as measured by PROM Baseline:  Goal status: INITIAL  4.  Pt will use abdominal breathing when producing sentences to support phonation in 80% of trials across 3 data sessions.  Baseline:  Goal status: INITIAL   LONG TERM GOALS: Target date: 04/19/2024  The patient will participate in 5-8 minutes conversation, maintaining average loudness of 75 dB and loud, good quality voice with min cues.     Baseline:  Goal status: INITIAL  2.   The patient will increase hydration for an eventual goal of 6-8 glasses per day and limit caffeine intake (to maximum of 1-2, 8 oz cups/day), as measured by patient report.  Baseline:  Goal status: INITIAL   ASSESSMENT:  CLINICAL IMPRESSION: Patient is a 73 y.o. female who was seen today for a voice treatment d/t presbylarynx. She presents with mild to moderate dysphonia that is c/b hoarse, strained, tremorus, low pitch vocal quality that is further exacerbated by signs of reflux and habitual harsh throat clearing.  .  Continued eagerness and great progress with plan for 1 additional session.  See the above treatment note for details.   OBJECTIVE IMPAIRMENTS include voice disorder. These impairments are limiting patient from effectively communicating at home and in community. Factors affecting potential to achieve goals and functional outcome are time post onset/reflux. Patient will benefit from skilled SLP services to address above impairments and improve overall function.  REHAB POTENTIAL: Excellent  PLAN: SLP FREQUENCY: 1-2x/week  SLP DURATION: 8 weeks  PLANNED INTERVENTIONS: SLP instruction and feedback, Compensatory strategies, and Patient/family education    Ellenore Roscoe B. Rubbie, M.S., CCC-SLP, Tree surgeon Certified Brain Injury Specialist [facility]  [facility] Rehabilitation Services Office [phone] Ascom [phone] Fax [phone]

## 2024-03-16 ENCOUNTER — Ambulatory Visit: Admitting: Speech Pathology

## 2024-03-16 DIAGNOSIS — R490 Dysphonia: Secondary | ICD-10-CM | POA: Diagnosis not present

## 2024-03-16 NOTE — Therapy (Signed)
 OUTPATIENT SPEECH LANGUAGE PATHOLOGY  VOICE TREATMENT   Patient Name: Deborah Singh MRN: [id number] DOB:[birth year], 73 y.o., female Today's Date: 03/16/2024  PCP: Lynwood Null, MD REFERRING PROVIDER: Chinita Hasten, MD (evaluation performed by Almarie Roe, PA)   End of Session - 03/16/24 1604     Visit Number 8    Number of Visits 17    Date for SLP Re-Evaluation 04/19/24    Progress Note Due on Visit 10    SLP Start Time 1020    SLP Stop Time  1100    SLP Time Calculation (min) 40 min    Activity Tolerance Patient tolerated treatment well          Past Medical History:  Diagnosis Date   Actinic keratosis    Anemia    Colon adenomas    De Quervain's tenosynovitis    GERD (gastroesophageal reflux disease)    Headache    Hyperlipidemia    Hypertension    Rheumatoid arthritis (HCC)    Past Surgical History:  Procedure Laterality Date   APPENDECTOMY     COLONOSCOPY     COLONOSCOPY WITH PROPOFOL  N/A 06/17/2017   Procedure: COLONOSCOPY WITH PROPOFOL ;  Surgeon: Viktoria Lamar DASEN, MD;  Location: [facility] ENDOSCOPY;  Service: Endoscopy;  Laterality: N/A;   COLONOSCOPY WITH PROPOFOL  N/A 02/19/2023   Procedure: COLONOSCOPY WITH PROPOFOL ;  Surgeon: Onita Elspeth Sharper, DO;  Location: [facility] ENDOSCOPY;  Service: Gastroenterology;  Laterality: N/A;   ESOPHAGOGASTRODUODENOSCOPY (EGD) WITH PROPOFOL  N/A 02/19/2023   Procedure: ESOPHAGOGASTRODUODENOSCOPY (EGD) WITH PROPOFOL ;  Surgeon: Onita Elspeth Sharper, DO;  Location: [facility] ENDOSCOPY;  Service: Gastroenterology;  Laterality: N/A;   FLEXIBLE SIGMOIDOSCOPY     KNEE ARTHROPLASTY Left 05/24/2021   Procedure: COMPUTER ASSISTED TOTAL KNEE ARTHROPLASTY - RNFA;  Surgeon: Mardee Lynwood SQUIBB, MD;  Location: [facility] ORS;  Service: Orthopedics;  Laterality: Left;   KNEE ARTHROPLASTY Right 09/16/2021   Procedure: COMPUTER ASSISTED TOTAL KNEE ARTHROPLASTY;  Surgeon: Mardee Lynwood SQUIBB, MD;  Location: [facility] ORS;  Service: Orthopedics;  Laterality:  Right;   POLYPECTOMY  02/19/2023   Procedure: POLYPECTOMY;  Surgeon: Onita Elspeth Sharper, DO;  Location: [facility] ENDOSCOPY;  Service: Gastroenterology;;   Patient Active Problem List   Diagnosis Date Noted   Total knee replacement status 09/16/2021   Everitt Curt tenosynovitis 05/24/2021   Dupuytren's contracture of hand 05/24/2021   GERD (gastroesophageal reflux disease) 05/24/2021   Hyperlipidemia 05/24/2021   Hypertension 05/24/2021   Status post total left knee replacement 05/24/2021   Primary osteoarthritis of both knees 03/31/2021   Bursitis of left shoulder 10/01/2015   Rotator cuff tendinitis, left 10/01/2015   Rheumatoid arthritis with rheumatoid factor (HCC) 03/31/2014    ONSET DATE:  couple of years ago,  date of referral 02/16/2024  REFERRING DIAG:  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 She has associated reflux, throat clearing, and dry throat. Pt says this has been going on for a while. After she talks for a certain amount of time, throat gets dry and she gets hoarse.  Laryngoscopy revealed arytenoid edema, and arytenoid erythema, bowing of bilateral vocal cords, consistent with presbylaryngis.  In addition, pt diagnosed with mild signs of reflux  started on 20 mg Omeprazole.   PAIN:  Are you having pain? No   FALLS: Has patient fallen in last 6 months? Yes,   LIVING ENVIRONMENT: Lives with: lives with their spouse Lives in: House/apartment  PLOF: Independent  PATIENT GOALS    to improve voice  SUBJECTIVE STATEMENT: Pt pleasant, eager, Pt accompanied by: self  OBJECTIVE:    SKILLED TREATMENT  NOTE: Skilled treatment session focused on pt's dysphonia goals. SLP facilitated session by providing the following interventions:  Pt reports continued improvement in functional voice use and decreased use of throat clear to manage mucus.   Pt is independent with vocal warm ups as well as voice strengthening exercises.   Pt also engaged in conversation with continued good vocal intensity and quality. During today's conversation, pt experienced one episode of raspy vocal quality that didn't clear with swallow or sip of water. Sustained ah was effective in achieving clear, strong vocal quality.    PATIENT EDUCATION: Education details: see above Person educated: Patient Education method: Explanation Education comprehension: needs further education   HOME EXERCISE PROGRAM: Add a glass of water Suppression techniques for throat clear Pectin based throat lozenge Abdominal breathing   GOALS: Goals reviewed with patient? Yes  SHORT TERM GOALS: Target date: 10 sessions  The patient will be independent for abdominal breathing and breath support exercises.  Baseline: Goal status: INITIAL: MET  2.  The patient will demonstrate independent understanding of vocal hygiene concepts.  Baseline:  Goal status: INITIAL: MET  3.  Patient will report improved communication effectiveness as measured by PROM Baseline:  Goal status: INITIAL: MET  4.  Pt will use abdominal breathing when producing sentences to support phonation in 80% of trials across 3 data sessions.  Baseline:  Goal status: INITIAL: MET   LONG TERM GOALS: Target date: 04/19/2024  The patient will participate in 5-8 minutes conversation, maintaining average loudness of 75 dB and loud, good quality voice with min cues.     Baseline:  Goal status: INITIAL: MET  2.  The patient will increase hydration for an eventual goal of 6-8 glasses per day and limit caffeine intake (to maximum of 1-2, 8 oz cups/day), as measured by  patient report.  Baseline:  Goal status: INITIAL: MET   ASSESSMENT:  CLINICAL IMPRESSION: Patient is a 73 y.o. female who was seen today for a voice treatment d/t presbylarynx. Pt has made great progress and as a result has met her STG/LTGs. She is independent with strategies to break the pattern of habitual throat clearing and voice strengthening exercises. As a result, her vocal quality has improved with increased ability to talk for more extended periods of time. At this time, recommend follow up with ENT edema and mild finding of reflux.   PLAN: Pt has met maximal benefit from skilled ST services.   Cathyrn Deas B. Rubbie, M.S., CCC-SLP, Tree surgeon Certified Brain Injury Specialist [facility]  [facility] Rehabilitation Services Office [phone] Ascom [phone] Fax [phone]

## 2024-03-31 DIAGNOSIS — H2513 Age-related nuclear cataract, bilateral: Secondary | ICD-10-CM | POA: Diagnosis not present

## 2024-03-31 DIAGNOSIS — H25013 Cortical age-related cataract, bilateral: Secondary | ICD-10-CM | POA: Diagnosis not present

## 2024-03-31 DIAGNOSIS — H04123 Dry eye syndrome of bilateral lacrimal glands: Secondary | ICD-10-CM | POA: Diagnosis not present

## 2024-03-31 DIAGNOSIS — H52203 Unspecified astigmatism, bilateral: Secondary | ICD-10-CM | POA: Diagnosis not present

## 2024-03-31 DIAGNOSIS — H524 Presbyopia: Secondary | ICD-10-CM | POA: Diagnosis not present

## 2024-03-31 DIAGNOSIS — H43813 Vitreous degeneration, bilateral: Secondary | ICD-10-CM | POA: Diagnosis not present

## 2024-04-26 DIAGNOSIS — R490 Dysphonia: Secondary | ICD-10-CM | POA: Diagnosis not present

## 2024-04-26 DIAGNOSIS — K219 Gastro-esophageal reflux disease without esophagitis: Secondary | ICD-10-CM | POA: Diagnosis not present

## 2024-06-13 DIAGNOSIS — Z79899 Other long term (current) drug therapy: Secondary | ICD-10-CM | POA: Diagnosis not present

## 2024-06-13 DIAGNOSIS — M0579 Rheumatoid arthritis with rheumatoid factor of multiple sites without organ or systems involvement: Secondary | ICD-10-CM | POA: Diagnosis not present

## 2024-06-13 DIAGNOSIS — M810 Age-related osteoporosis without current pathological fracture: Secondary | ICD-10-CM | POA: Diagnosis not present

## 2024-06-27 DIAGNOSIS — H66001 Acute suppurative otitis media without spontaneous rupture of ear drum, right ear: Secondary | ICD-10-CM | POA: Diagnosis not present

## 2024-06-27 DIAGNOSIS — H6993 Unspecified Eustachian tube disorder, bilateral: Secondary | ICD-10-CM | POA: Diagnosis not present

## 2025-02-20 ENCOUNTER — Encounter: Admitting: Dermatology
